# Patient Record
Sex: Female | Born: 1940
Health system: Western US, Academic
[De-identification: ages and names within clinical notes are randomized; demographics above are authoritative.]

---

## 2013-03-26 DIAGNOSIS — Z9079 Acquired absence of other genital organ(s): Secondary | ICD-10-CM

## 2013-03-26 DIAGNOSIS — Z90722 Acquired absence of ovaries, bilateral: Secondary | ICD-10-CM

## 2013-03-26 DIAGNOSIS — Z79890 Hormone replacement therapy (postmenopausal): Secondary | ICD-10-CM

## 2013-03-26 DIAGNOSIS — Z90710 Acquired absence of both cervix and uterus: Secondary | ICD-10-CM

## 2016-08-05 DIAGNOSIS — C8330 Diffuse large B-cell lymphoma, unspecified site: Secondary | ICD-10-CM

## 2016-08-09 DIAGNOSIS — D61810 Antineoplastic chemotherapy induced pancytopenia: Secondary | ICD-10-CM

## 2016-08-09 DIAGNOSIS — K1232 Oral mucositis (ulcerative) due to other drugs: Secondary | ICD-10-CM

## 2016-08-09 DIAGNOSIS — D708 Other neutropenia: Secondary | ICD-10-CM

## 2016-08-09 DIAGNOSIS — D701 Agranulocytosis secondary to cancer chemotherapy: Secondary | ICD-10-CM

## 2016-08-09 DIAGNOSIS — T451X5A Adverse effect of antineoplastic and immunosuppressive drugs, initial encounter: Secondary | ICD-10-CM

## 2016-08-09 DIAGNOSIS — T451X5S Adverse effect of antineoplastic and immunosuppressive drugs, sequela: Secondary | ICD-10-CM

## 2016-08-09 DIAGNOSIS — K1231 Oral mucositis (ulcerative) due to antineoplastic therapy: Secondary | ICD-10-CM

## 2016-10-17 ENCOUNTER — Telehealth: Payer: MEDICARE

## 2016-10-26 ENCOUNTER — Ambulatory Visit: Payer: BLUE CROSS/BLUE SHIELD

## 2016-10-28 ENCOUNTER — Ambulatory Visit: Payer: BLUE CROSS/BLUE SHIELD

## 2016-10-28 ENCOUNTER — Ambulatory Visit: Payer: MEDICARE

## 2016-10-28 DIAGNOSIS — G629 Polyneuropathy, unspecified: Secondary | ICD-10-CM

## 2016-10-28 DIAGNOSIS — R5383 Other fatigue: Secondary | ICD-10-CM

## 2016-10-31 ENCOUNTER — Telehealth: Payer: MEDICARE

## 2016-12-09 ENCOUNTER — Ambulatory Visit: Payer: MEDICARE

## 2016-12-09 ENCOUNTER — Ambulatory Visit: Payer: BLUE CROSS/BLUE SHIELD

## 2016-12-09 DIAGNOSIS — D708 Other neutropenia: Secondary | ICD-10-CM

## 2016-12-09 DIAGNOSIS — R5383 Other fatigue: Secondary | ICD-10-CM

## 2016-12-09 DIAGNOSIS — K1232 Oral mucositis (ulcerative) due to other drugs: Secondary | ICD-10-CM

## 2016-12-13 ENCOUNTER — Ambulatory Visit: Payer: MEDICARE

## 2017-01-04 ENCOUNTER — Telehealth: Payer: MEDICARE

## 2017-01-13 ENCOUNTER — Telehealth: Payer: MEDICARE

## 2017-01-13 ENCOUNTER — Ambulatory Visit: Payer: MEDICARE

## 2017-01-13 DIAGNOSIS — R5383 Other fatigue: Secondary | ICD-10-CM

## 2017-01-16 ENCOUNTER — Telehealth: Payer: BLUE CROSS/BLUE SHIELD

## 2017-01-31 ENCOUNTER — Ambulatory Visit: Payer: MEDICARE

## 2017-01-31 ENCOUNTER — Telehealth: Payer: BLUE CROSS/BLUE SHIELD

## 2017-02-13 ENCOUNTER — Telehealth: Payer: BLUE CROSS/BLUE SHIELD

## 2017-02-23 ENCOUNTER — Ambulatory Visit: Payer: MEDICARE

## 2017-02-24 ENCOUNTER — Ambulatory Visit: Payer: MEDICARE

## 2017-03-21 ENCOUNTER — Ambulatory Visit: Payer: MEDICARE

## 2017-03-27 ENCOUNTER — Ambulatory Visit: Payer: BLUE CROSS/BLUE SHIELD

## 2017-03-31 ENCOUNTER — Ambulatory Visit: Payer: BLUE CROSS/BLUE SHIELD

## 2017-03-31 DIAGNOSIS — R5383 Other fatigue: Secondary | ICD-10-CM

## 2017-03-31 DIAGNOSIS — T451X5A Adverse effect of antineoplastic and immunosuppressive drugs, initial encounter: Secondary | ICD-10-CM

## 2017-03-31 DIAGNOSIS — D61810 Antineoplastic chemotherapy induced pancytopenia: Secondary | ICD-10-CM

## 2017-04-04 ENCOUNTER — Ambulatory Visit: Payer: BLUE CROSS/BLUE SHIELD | Attending: Gynecologic Oncology

## 2017-05-10 ENCOUNTER — Telehealth: Payer: MEDICARE

## 2017-05-10 NOTE — Telephone Encounter
Dr. Renard Hamper,    Pt called, wanted to speak to you re: hard lump on her leg. Stated her PCP thinks it might be phlebitis and told her take aspirin but states it is not working. Would like to know what she could do.  [phone]    Thank you,  Annette Hatfield

## 2017-05-10 NOTE — Telephone Encounter
Lump behind knee   Not lymphoma  Ice pack and ace wrap  Not linear so unlikely phlebitis  Discussed with patient by phone

## 2017-06-22 ENCOUNTER — Telehealth: Payer: BLUE CROSS/BLUE SHIELD

## 2017-06-22 ENCOUNTER — Ambulatory Visit: Payer: MEDICARE

## 2017-06-22 ENCOUNTER — Telehealth: Payer: MEDICARE

## 2017-06-22 DIAGNOSIS — R5383 Other fatigue: Secondary | ICD-10-CM

## 2017-06-22 NOTE — Telephone Encounter
FYI only    Dr. Renard Hamper,    Scheduled pt for a PET/CT scan. Next available appointment is 06/30/17 @ 9:30 (reg).   Pt was placed on a cancellation list for a sooner appointment.   Confirmed appt with pt, if unable to get in sooner will call us to reschedule the follow-up appointment.     Thank you,  Chasey Dull

## 2017-06-22 NOTE — Telephone Encounter
Thanks

## 2017-06-22 NOTE — Telephone Encounter
Online- submitted order to [facility] for PET.

## 2017-06-23 ENCOUNTER — Ambulatory Visit: Payer: BLUE CROSS/BLUE SHIELD

## 2017-06-30 ENCOUNTER — Ambulatory Visit: Payer: MEDICARE

## 2017-06-30 ENCOUNTER — Telehealth: Payer: MEDICARE

## 2017-06-30 DIAGNOSIS — L989 Disorder of the skin and subcutaneous tissue, unspecified: Secondary | ICD-10-CM

## 2017-06-30 DIAGNOSIS — R5383 Other fatigue: Secondary | ICD-10-CM

## 2017-06-30 NOTE — Telephone Encounter
Dr Aaron Mose called asked if your recommendations are based on the final path report, she can be reached at [phone]     Thank you  Dewaine Oats

## 2017-06-30 NOTE — Telephone Encounter
Final report was atypical lymphoid infiltrate  Will get more info from complete excision

## 2017-06-30 NOTE — Patient Instructions
NASAL LARGE CELL LYMPHOMA:  As we discussed, it was reasonable to modify R-CHOP doses and to have switched to Gemzar given your side effects; fortunately your heart analysis was normal and your neuropathy should fade away over months; you have also finished RT      In Sept 2018 your follow up PET approximately 3 months after finishing RT showed complete remission (CR)    You developed a lesion on your right leg which was biopsied and called an atypical lymphoid infiltrate (possibly large cell lymphoma but NOT DEFINITIVE).  Your Dec 2018 PET was otherwise negative for relapsed lymphoma.    I recommend:  1. Surgical removal of the right thigh lesion and additional testing including molecular testing for immunoglobulin gene rearrangement (to determine clonality)  2. Careful follow up and likely repeat PET in 4-6 months if stable  3. Discuss with Dr Harden Mo possibly seeing a pulmonologist for bronchoscopy to remove the likely mucus plug in your lung airway    Your other blood tests were fine    You are doing all of the appropriate health maintenance habits and testing

## 2017-06-30 NOTE — Telephone Encounter
Discussed with pt by phone

## 2017-06-30 NOTE — Progress Notes
PATIENT: Annette Hatfield  MRN: [id number]  DOB: 23-Jan-1941  DATE OF SERVICE: 06/30/2017    CHIEF COMPLAINT:   Nasal large cell NHL; f/u    Subjective:      Annette Hatfield is a 76 y.o. 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  On 10-07-16 she presented with her retired urologist husband in the midst of R-CHOP rx with Dr Alyce Pagan.  She described substantial side effects with atypical CP prompting ASAP cor work up with apparently normal ECHO and negative stress test but also switched to Gemzar from Adriamycin.  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  She had been receiving sporadic Neupogen and was due next Thursday for C3 D8 Gemzar.  We discussed ASAP PET and seeing Rad Onc for possible IFRT vs 3 more modified R-CHOP (back to 80 % Adria from Gemzar and no VCR)    On 10-28-16 she presented in f/u with 10-25-16 PET demonstrating complete resolution of the right nasal mass and right submandibular LN and with some non-specific let nasopharynx PET + area c/w the flare in her seasonal allergies.  She had an MRI of the area today.  I concurred with no more chemo and f/u for IFRT with Rad Onc and likely repeat PET 3 months after finishing RT then prn.  I recommended keeping port for now with q 6 week flushes    On 12-09-16 she presented in f/u on her last day of RT.  She was feeling much better with no significant residual c/o.  We discussed port flush x 2 more with PET in 3 mo and if CR then remove port    On 01-13-17 she presented with her husband without c/o and recently having been seen on 01-03-17 by Dr Bonney Leitz with exam confirming no recurrence locally.  She had a wedding of a grand-daughter in Sept 2018 and wanted her port removed to allow for a certain dress so she was referred to IR.  She will repeat a PET in early Oct 2018 and f/u here.    On 03-31-17 she presented without c/o and with her Sept 2018 PET-CT c/w CR. She also had a recent mammo and Cologuard test which were negative.  She has f/u scheduled with Dr Bonney Leitz and was told to f/u in 3 months. Her recent Sept 2018 blood tests were fine    On 06-30-17 she presented in f/u with a right thigh lesion bx showing an atypical lymphoid infiltrate and Dec 2018 PET without evidence of relapsed NHL.  I recommended complete excision and molecular testing for clonality and no further rx for now but did discuss possible R-ICE or the equivalent if systemic relapse.  She will f/u in 3 mo    HISTORY OF PRESENT ILLNESS:  The patient has been in generally excellent health. She has been quite focused on natural and alternative therapy throughout her life and healthy habits. 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  ???  She had been in her usual state of health until she began having trouble with some swelling along her right nose. Initially, she thought it might be skin cancer related, given a prior basal cell, but this has progressed. She was referred to ENT, which took some time for initial evaluation and then also referred for a CT scan which took time to schedule. She also underwent a biopsy of a right nasal  tissue abnormality which on 07/15/2016 showed a diffuse large B-cell lymphoma, non germinal center type. In addition, she underwent a PET-CT scan on 08/03/2016, which showed a 2.1 cm right nasal cavity abnormality and a new 2.5 cm right submandibular lymph node.  ???  The patient presented with her husband, a retired Insurance underwriter, for a detailed discussion of possible therapeutic options. I discussed with her standard therapy and then discussed the modification she felt were quite important in her potential future treatment. The patient had no other new complaints and specifically denies fevers, sweats, anorexia, weight loss, lymph node swelling beyond the right submandibular area, skin rash, joint swelling, or bone pain.  ?????? ALLERGIES:  No reported drug allergies.  ???  PAST SURGICAL HISTORY:  No significant prior surgical procedures beyond the biopsies noted above.  ???  PAST MEDICAL HISTORY:  No significant prior medical problems.  ???  FAMILY HISTORY:  The patient's mother had breast cancer in her mid 5s and had colorectal cancer. The patient has 1 sister, 1 son and 1 daughter.    Review of Systems  - Negative for constitutional, neurological, ophthalmological, otological, cardiovascular, respiratory, gastrointestinal, genitourinary, psychological or musculoskeletal symptoms except as noted above    Outpatient Medications Prior to Visit   Medication Sig   ??? Desloratadine (CLARINEX PO) Take by mouth daily as needed.   ??? estradiol (VIVELLE-DOT) 0.0375 mg/24 hr twice weekly patch Place 1 patch onto the skin two (2) times a week.   ??? ESTRING 2 MG vaginal ring PLACE 1 DEVICE VAGINALLY EVERY THREE (3) MONTHS FOLLOW PACKAGE DIRECTIONS.Marland Kitchen     No facility-administered medications prior to visit.          Objective:      Physical Exam BP 120/76  ~ Pulse 104  ~ Temp 36.6 ???C (97.8 ???F) (Tympanic)  ~ Resp 20  ~ Ht 157.5 cm  ~ Wt 58.1 kg (128 lb)  ~ BMI 23.41 kg/m???       Wt Readings from Last 3 Encounters:   06/30/17 58.1 kg (128 lb)   03/31/17 58.5 kg (129 lb)   01/13/17 59.1 kg (130 lb 3.2 oz)     General Appearance:    Alert, cooperative, no distress, appears stated age   Head:    Normocephalic, without obvious abnormality, atraumatic; chemo induced alopecia   Eyes:    conjunctiva/corneas clear, EOM's intact   Ears:    Not done   Nose:   Nares normal, septum midline   Throat:   Lips, mucosa, and tongue normal   Neck:   Supple, symmetrical, trachea midline, no adenopathy   Back:     Symmetric, no curvature   Lungs:     Clear to auscultation bilaterally, respirations unlabored   Chest Wall:    No tenderness or deformity; right chest port removed    Heart:    Regular rate and rhythm, S1 and S2 normal, no murmur, rub    or gallop Breast Exam:    Not done   Abdomen:     Soft, non-tender, bowel sounds active all four quadrants,     no masses, no organomegaly   Genitalia:    Not done   Rectal:    Not done   Extremities:   Extremities normal, atraumatic, no cyanosis or edema   Pulses:   Not done   Skin:   Skin color, texture, turgor normal, no rashes or lesions   Lymph nodes:   Cervical, supraclavicular, and axillary nodes normal  Neurologic:   CNII-XII intact, normal strength, sensation and gait     Assessment:       LABS:  06-21-17 CBC OK Hb 14.5 CMP OK CR 1.0 Vit D  91 A1c 5.5    No visits with results within 4 Week(s) from this visit.   Latest known visit with results is:   Lab Visit on 03/21/2017   Component Date Value Ref Range Status   ??? WBC (LabDAQ) 03/21/2017 7.9  4.0 - 10.0 10???/uL Final   ??? RBC (LabDAQ) 03/21/2017 4.1  3.9 - 6.1 x10E6/uL Final   ??? Hemoglobin (LabDAQ) 03/21/2017 13.2  11.2 - 15.7 g/dL Final   ??? Hematocrit (LabDAQ) 03/21/2017 40.0  34.1 - 44.9 % Final   ??? MCV (LabDAQ) 03/21/2017 98.0* 79.0 - 94.8 fL Final   ??? MCH (LabDAQ) 03/21/2017 32.4* 25.6 - 32.2 pg Final   ??? MCHC (LabDAQ) 03/21/2017 33.0  32.2 - 36.5 g/dL Final   ??? RDW-CV (LabDAQ) 03/21/2017 13.0  11.6 - 14.4 % Final   ??? Platelets (LabDAQ) 03/21/2017 259  163 - 369 10???/uL Final   ??? MPV (LabDAQ) 03/21/2017 11.2  9.4 - 12.4 fL Final   ??? Neutrophil % (LabDAQ) 03/21/2017 79.9* 34.0 - 71.1 % Final   ??? Lymphocyte % (LabDAQ) 03/21/2017 12.2* 19.3 - 53.1 % Final   ??? Monocyte % (LabDAQ) 03/21/2017 6.7  4.7 - 12.5 % Final   ??? Eosinophil % (LabDAQ) 03/21/2017 0.6* 0.7 - 7.0 % Final   ??? Basophil % (LabDAQ) 03/21/2017 0.5  0.1 - 1.2 % Final   ??? Neutrophil # (LabDAQ) 03/21/2017 6.32* 1.56 - 6.13 10???/uL Final   ??? Lymphocyte # (LabDAQ) 03/21/2017 0.97* 1.18 - 3.74 10???/uL Final   ??? Monocyte # (LabDAQ) 03/21/2017 0.53  0.24 - 0.86 10???/uL Final   ??? Eosinophil # (LabDAQ) 03/21/2017 0.05  0.04 - 0.54 10???/uL Final   ??? Basophil # (LabDAQ) 03/21/2017 0.04  0.01 - 0.08 10???/uL Final ??? IG% (LabDAQ) 03/21/2017 0.1  0.0 - 5.0 % Final   ??? IG# (LabDAQ) 03/21/2017 0.01  0.00 - 0.50 10 Final    IG=Immature Granulocytes.   ??? Sodium 03/21/2017 145  135 - 146 mmol/L Final   ??? Potassium 03/21/2017 4.8  3.6 - 5.3 mmol/L Final   ??? Chloride 03/21/2017 103  96 - 106 mmol/L Final   ??? Total CO2 03/21/2017 26  20 - 30 mmol/L Final   ??? Anion Gap 03/21/2017 16  8 - 19 Final   ??? Glucose 03/21/2017 97  65 - 99 mg/dL Final   ??? GFR Estimate for Non-African Ameri* 03/21/2017 71  See GFR Additional Information Final   ??? GFR Estimate for African American 03/21/2017 82  See GFR Additional Information Final   ??? GFR Additional Information 03/21/2017 See Comment   Final    GFR >89        Normal  GFR 60 - 89    Normal to mildly decreased  GFR 45 - 59    Mildly to moderately decreased  GFR 30 - 44    Moderately to severely decreased  GFR 15 - 29    Severely decreased  GFR <15        Kidney failure  The CKD-EPI equation was used to estimate GFR and assumes stable creatinine concentrations.  Results are in mL/min/1.73 square meters.   ??? Creatinine 03/21/2017 0.81  0.60 - 1.30 mg/dL Final   ??? Urea Nitrogen 03/21/2017 16  7 - 22 mg/dL Final   ???  Calcium 03/21/2017 9.7  8.6 - 10.4 mg/dL Final   ??? Total Protein 03/21/2017 6.7  6.1 - 8.2 g/dL Final   ??? Albumin 45/40/9811 4.8  3.9 - 5.0 g/dL Final   ??? Bilirubin,Total 03/21/2017 0.7  0.1 - 1.2 mg/dL Final   ??? Alkaline Phosphatase 03/21/2017 60  37 - 113 U/L Final   ??? Aspartate Aminotransferase 03/21/2017 19  13 - 47 U/L Final   ??? Alanine Aminotransferase 03/21/2017 17  8 - 64 U/L Final     12-09-16 CBC OK Hb 12.7   CMP OK Cr 0.74  (last day of RT)  10-28-16 CBC 9.1 > 10.4 <104> 489  CMP OK Cr 0.82  B 12 > 4000  TSH 1.6 Vit D  77 (s-p 3 cycles chemo; April 2018 PET CR; proceed with IFRT)  10-06-16 CBC 40.9 > 11.9 < 232  CMP OK Cr 0.9 ALT/ AST 76/ 49  (C3 D1 R & CTX & Gem & pred)  07/25/2016, CBC: White count 14.3, hemoglobin 15.1, MCV 99, platelets 354. CMP normal with a creatinine of 1.0, LDH 144, vitamin D 91, B12 679.     IMAGING:  06-26-17 PET-CT:  Negative except for tubular structure RML (impacted bronchus); mild non-specific distal right thigh uptake    03-27-17 PET-CT:  Negative    10-25-16 PET-CT:  Resolution of both right nasal cavity and right submandibular mass; focal + left posterior nasopharynx (MRI suggested)  08/03/2016, PET-CT:   2.1 cm right nasal cavity mass and a new 2.5 cm right submandibular lymph node (as compared to a scan in December)    09-23-16 EXERCISE SPECT:  Hyperdynamic EF 78%; no ischemia    PATHOLOGY:  06-07-17 SKIN RIGHT THIGH:  Atypical lymphoid infiltrate (final report)    right nasal cavity tissue biopsy, diffuse large B-cell lymphoma, non germinal center type with immunohistochemistry negative for BCL 1 and BCL 6 and negative for c-myc and EBV. Ki 67 80-90 %      Plan:       1. NASAL LARGE CELL NHL:   Started with concerns about side effects and described substantial particularly neuropathy type with R-CHOP with switch from Adria to Gemzar and VCR held from C3; given presumptive CR on 10-25-16 PET I concurred with no more chemo and proceeding with IFRT; flush port; Sept 2018 repeat PET (3-4 months after finishing RT) showed CR so scans prn only; NED per Dr Bonney Leitz in June 2018 with continued every 4-6 mo f/u and Sept 2018 PET showed CR; Dec 2018 right thigh lesion with atypical lymphoid infiltrate; Dec 2018 PET without evidence of relapsed NHL.  I recommended complete excision and molecular testing for clonality and no further rx for now but did discuss possible R-ICE or the equivalent if systemic relapse.  She will f/u in 3 mo    2. Pancytopenia/ neutropenia, chemo induced:   Resolved off chemo; received 3-5 Neupogen per cycle at [facility]    3. Neuropathy:   Resolved by Sept 2018; likely reaction to VCR    4. Anxiety:   Not interested in med rx    5. Port:   Removed by IR in June 2018    6. Thigh skin lesion; Dec 2018 right thigh lesion with atypical lymphoid infiltrate; Dec 2018 PET without evidence of relapsed NHL.  I recommended complete excision and molecular testing for clonality and no further rx for now but did discuss possible R-ICE or the equivalent if systemic relapse.  She will f/u in 3  mo        PLAN:  NASAL LARGE CELL LYMPHOMA:  As we discussed, it was reasonable to modify R-CHOP doses and to have switched to Gemzar given your side effects; fortunately your heart analysis was normal and your neuropathy should fade away over months; you have also finished RT      In Sept 2018 your follow up PET approximately 3 months after finishing RT showed complete remission (CR)    You developed a lesion on your right leg which was biopsied and called an atypical lymphoid infiltrate (possibly large cell lymphoma but NOT DEFINITIVE).  Your Dec 2018 PET was otherwise negative for relapsed lymphoma.    I recommend:  1. Surgical removal of the right thigh lesion and additional testing including molecular testing for immunoglobulin gene rearrangement (to determine clonality)  2. Careful follow up and likely repeat PET in 4-6 months if stable  3. Discuss with Dr Stephens Shire possibly seeing a pulmonologist for bronchoscopy to remove the likely mucus plug in your lung airway    Your other blood tests were fine    You are doing all of the appropriate health maintenance habits and testing    The low lymphocytes are due to the Rituxan and are of no clinical significance      Author:  Theressa Millard 06/30/2017 10:07 AM

## 2017-07-05 ENCOUNTER — Telehealth: Payer: MEDICARE

## 2017-07-06 NOTE — Telephone Encounter
Dr Renard Hamper,  Pt called and is very confused. She would like to know where & how she is supposed to get the molecular testing for immunoglobulin gene rearrangement you recommended and will you provide the order.  Please call her at [phone]

## 2017-07-06 NOTE — Telephone Encounter
Spoke with Annette Hatfield from Dr. Almyra Free office, faxed over recent scans that was requested by pt to be sent. Also, notify that Dr. Renard Hamper would like the following testings listed below. Provided information of lab that we use, [facility].  Spoke with pt as well.

## 2017-07-06 NOTE — Telephone Encounter
Please have specimen sent to wherever the bone marrow specimens are sent  [facility] TOLD TO CALL [place] [facility]ICE    MUST ASK FOR   FLOW CYTOMETRY (IF POSSIBLE)  IMMUNOHISTOCHEMICAL STAINS FOR KAPPA & LAMBDA  IMMUNOGLOBULIN HEAVY CHAIN GENE REARRANGEMENT STUDIES    SPECIMEN WILL BE A THIGH SKIN LESION

## 2017-07-06 NOTE — Telephone Encounter
Spoke with Mrs Annette Hatfield let her know everything has been ordered and faxed, pt understood will go to Dr hung on 07/19/17    Spoke with Marzetta Board at Dr Benson [place] office received pathology and records

## 2017-07-06 NOTE — Telephone Encounter
thanks

## 2017-07-25 ENCOUNTER — Ambulatory Visit: Payer: BLUE CROSS/BLUE SHIELD

## 2017-07-26 ENCOUNTER — Telehealth: Payer: MEDICARE

## 2017-07-26 NOTE — Telephone Encounter
Dr. Renard Hamper,    Deliah from [facility] called and asked for the B-Cell Rearrangement test would you like them to use fresh tissue or paraffin embedded tissue.    Ref # [id number]  Call back # [phone]    Thank you,  Paulita Cradle

## 2017-07-26 NOTE — Telephone Encounter
Returned call  IgH chain rearrangement study ideally on fresh tissue

## 2017-08-06 ENCOUNTER — Ambulatory Visit: Payer: MEDICARE

## 2017-08-07 ENCOUNTER — Ambulatory Visit: Payer: BLUE CROSS/BLUE SHIELD

## 2017-08-08 ENCOUNTER — Ambulatory Visit: Payer: MEDICARE

## 2017-08-08 NOTE — Telephone Encounter
DISCUSSED WITH PATIENT BY PHONE    REPEAT BX CONFIRMED LARGE CELL NHL IN LEG SKIN    STANDARD OF CARE:  R-ICE OR THE EQUIVALENT FOLLOWED BY AUTO-SCT DISCUSSED    STRONGLY AGAINST MORE CHEMO SO ALTERNATIVE:  IFRT WITH DR Cherene Altes  RITUXAN AND REVLIMID (R-2 REGIMEN)

## 2017-08-09 ENCOUNTER — Telehealth: Payer: BLUE CROSS/BLUE SHIELD

## 2017-08-09 NOTE — Telephone Encounter
Per pt request faxed pathology to PCP Dr Eliott Nine

## 2017-08-11 ENCOUNTER — Ambulatory Visit: Payer: MEDICARE

## 2017-08-11 LAB — Comprehensive Metabolic Panel
ALBUMIN: 4.6 g/dL (ref 3.9–5.0)
CALCIUM: 9.9 mg/dL (ref 8.6–10.4)

## 2017-08-11 LAB — Lactate Dehydrogenase: LACTATE DEHYDROGENASE: 151 U/L (ref 125–256)

## 2017-08-11 MED ORDER — LENALIDOMIDE 25 MG PO CAPS
25 mg | Freq: Every day | ORAL | 5 refills | Status: AC
Start: 2017-08-11 — End: ?

## 2017-08-11 NOTE — Progress Notes
PATIENT: Annette Hatfield  MRN: [id number]  DOB: [birth year]  DATE OF SERVICE: 08/11/2017    CHIEF COMPLAINT:   Nasal large cell NHL; f/u    Subjective:      Annette Hatfield is a 77 y.o. 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  On 10-07-16 she presented with her retired urologist husband in the midst of R-CHOP rx with Dr Alyce Pagan.  She described substantial side effects with atypical CP prompting ASAP cor work up with apparently normal ECHO and negative stress test but also switched to Gemzar from Adriamycin.  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  She had been receiving sporadic Neupogen and was due next Thursday for C3 D8 Gemzar.  We discussed ASAP PET and seeing Rad Onc for possible IFRT vs 3 more modified R-CHOP (back to 80 % Adria from Gemzar and no VCR)    On 10-28-16 she presented in f/u with 10-25-16 PET demonstrating complete resolution of the right nasal mass and right submandibular LN and with some non-specific let nasopharynx PET + area c/w the flare in her seasonal allergies.  She had an MRI of the area today.  I concurred with no more chemo and f/u for IFRT with Rad Onc and likely repeat PET 3 months after finishing RT then prn.  I recommended keeping port for now with q 6 week flushes    On 12-09-16 she presented in f/u on her last day of RT.  She was feeling much better with no significant residual c/o.  We discussed port flush x 2 more with PET in 3 mo and if CR then remove port    On 01-13-17 she presented with her husband without c/o and recently having been seen on 01-03-17 by Dr Bonney Leitz with exam confirming no recurrence locally.  She had a wedding of a grand-daughter in Sept 2018 and wanted her port removed to allow for a certain dress so she was referred to IR.  She will repeat a PET in early Oct 2018 and f/u here.    On 03-31-17 she presented without c/o and with her Sept 2018 PET-CT c/w CR. She also had a recent mammo and Cologuard test which were negative.  She has f/u scheduled with Dr Bonney Leitz and was told to f/u in 3 months. Her recent Sept 2018 blood tests were fine    On 06-30-17 she presented in f/u with a right thigh lesion bx showing an atypical lymphoid infiltrate and Dec 2018 PET without evidence of relapsed NHL.  I recommended complete excision and molecular testing for clonality and no further rx for now but did discuss possible R-ICE or the equivalent if systemic relapse.  She will f/u in 3 mo    On 08-11-17 she presented after repeat incisional thigh lesion bx confirming relapsed large cell NHL.  We had had multiple phone conversations before and after the repeat bx and again today in person (over 50 minutes). While I recommended R-ICE then auto SCT she continued to resist any multi agent chemo, even if the only known path to possible cure.  She was concerned about side effects and her being able to help her ailing husband.  I recommended as a compromise Rituxan & Revlimid and IFRT with Dr Clinton Sawyer to the thigh lesion.  The rationale and side effects were discussed in detail and numerous questions answered.  She will tentatively start in 2 weeks on R-2 regimen  HISTORY OF PRESENT ILLNESS:  The patient has been in generally excellent health. She has been quite focused on natural and alternative therapy throughout her life and healthy habits. 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  ???  She had been in her usual state of health until she began having trouble with some swelling along her right nose. Initially, she thought it might be skin cancer related, given a prior basal cell, but this has progressed. She was referred to ENT, which took some time for initial evaluation and then also referred for a CT scan which took time to schedule. She also underwent a biopsy of a right nasal tissue abnormality which on 07/15/2016 showed a diffuse large B-cell lymphoma, non germinal center type. In addition, she underwent a PET-CT scan on 08/03/2016, which showed a 2.1 cm right nasal cavity abnormality and a new 2.5 cm right submandibular lymph node.  ???  The patient presented with her husband, a retired Insurance underwriter, for a detailed discussion of possible therapeutic options. I discussed with her standard therapy and then discussed the modification she felt were quite important in her potential future treatment. The patient had no other new complaints and specifically denies fevers, sweats, anorexia, weight loss, lymph node swelling beyond the right submandibular area, skin rash, joint swelling, or bone pain.  ??????  ALLERGIES:  No reported drug allergies.  ???  PAST SURGICAL HISTORY:  No significant prior surgical procedures beyond the biopsies noted above.  ???  PAST MEDICAL HISTORY:  No significant prior medical problems.  ???  FAMILY HISTORY:  The patient's mother had breast cancer in her mid 42s and had colorectal cancer. The patient has 1 sister, 1 son and 1 daughter.    Review of Systems  - Negative for constitutional, neurological, ophthalmological, otological, cardiovascular, respiratory, gastrointestinal, genitourinary, psychological or musculoskeletal symptoms except as noted above    Outpatient Medications Prior to Visit   Medication Sig   ??? Desloratadine (CLARINEX PO) Take by mouth daily as needed.   ??? estradiol (VIVELLE-DOT) 0.0375 mg/24 hr twice weekly patch Place 1 patch onto the skin two (2) times a week.   ??? ESTRING 2 MG vaginal ring PLACE 1 DEVICE VAGINALLY EVERY THREE (3) MONTHS FOLLOW PACKAGE DIRECTIONS.Marland Kitchen     No facility-administered medications prior to visit.          Objective:      Physical Exam Ht 157.5 cm  ~ BMI 23.41 kg/m???     BP 120/74  ~ Pulse 68  ~ Temp 36.4 ???C (97.6 ???F) (Tympanic)  ~ Resp 17  ~ Ht 157.5 cm  ~ Wt 56.8 kg (125 lb 3.2 oz)  ~ Breastfeeding? No  ~ BMI 22.89 kg/m???   Wt Readings from Last 3 Encounters: 06/30/17 58.1 kg (128 lb)   03/31/17 58.5 kg (129 lb)   01/13/17 59.1 kg (130 lb 3.2 oz)     General Appearance:    Alert, cooperative, no distress, appears stated age   Head:    Normocephalic, without obvious abnormality, atraumatic; chemo induced alopecia   Eyes:    conjunctiva/corneas clear, EOM's intact   Ears:    Not done   Nose:   Nares normal, septum midline   Throat:   Lips, mucosa, and tongue normal   Neck:   Supple, symmetrical, trachea midline, no adenopathy   Back:     Symmetric, no curvature   Lungs:     Clear to auscultation  bilaterally, respirations unlabored   Chest Wall:    No tenderness or deformity; right chest port removed    Heart:    Regular rate and rhythm, S1 and S2 normal, no murmur, rub    or gallop   Breast Exam:    Not done   Abdomen:     Soft, non-tender, bowel sounds active all four quadrants,     no masses, no organomegaly   Genitalia:    Not done   Rectal:    Not done   Extremities:   Extremities normal, atraumatic, no cyanosis or edema   Pulses:   Not done   Skin:   Skin color, texture, turgor normal, no rashes or lesions   Lymph nodes:   Cervical, supraclavicular, and axillary nodes normal   Neurologic:   CNII-XII intact, normal strength, sensation and gait     Assessment:       LABS:    Initial consult on 08/11/2017   Component Date Value Ref Range Status   ??? WBC (LabDAQ) 08/11/2017 14.3* 4.0 - 10.0 10???/uL Final   ??? RBC (LabDAQ) 08/11/2017 4.4  3.9 - 6.1 x10E6/uL Final   ??? Hemoglobin (LabDAQ) 08/11/2017 14.4  11.2 - 15.7 g/dL Final   ??? Hematocrit (LabDAQ) 08/11/2017 44.1  34.1 - 44.9 % Final   ??? MCV (LabDAQ) 08/11/2017 99.5* 79.0 - 94.8 fL Final   ??? MCH (LabDAQ) 08/11/2017 32.5* 25.6 - 32.2 pg Final   ??? MCHC (LabDAQ) 08/11/2017 32.7  32.2 - 36.5 g/dL Final   ??? RDW-CV (LabDAQ) 08/11/2017 12.0  11.6 - 14.4 % Final   ??? Platelets (LabDAQ) 08/11/2017 260  163 - 369 10???/uL Final   ??? MPV (LabDAQ) 08/11/2017 9.9  9.4 - 12.4 fL Final ??? Neutrophil % (LabDAQ) 08/11/2017 88.3* 34.0 - 71.1 % Final   ??? Lymphocyte % (LabDAQ) 08/11/2017 4.5* 19.3 - 53.1 % Final   ??? Monocyte % (LabDAQ) 08/11/2017 6.6  4.7 - 12.5 % Final   ??? Eosinophil % (LabDAQ) 08/11/2017 0.2* 0.7 - 7.0 % Final   ??? Basophil % (LabDAQ) 08/11/2017 0.3  0.1 - 1.2 % Final   ??? Neutrophil # (LabDAQ) 08/11/2017 12.64* 1.56 - 6.13 10???/uL Final   ??? Lymphocyte # (LabDAQ) 08/11/2017 0.65* 1.18 - 3.74 10???/uL Final   ??? Monocyte # (LabDAQ) 08/11/2017 0.95* 0.24 - 0.86 10???/uL Final   ??? Eosinophil # (LabDAQ) 08/11/2017 0.03* 0.04 - 0.54 10???/uL Final   ??? Basophil # (LabDAQ) 08/11/2017 0.04  0.01 - 0.08 10???/uL Final   ??? IG% (LabDAQ) 08/11/2017 0.1  0.0 - 5.0 % Final   ??? IG# (LabDAQ) 08/11/2017 0.02  0.00 - 0.50 10 Final    IG=Immature Granulocytes.     06-21-17 CBC OK Hb 14.5 CMP OK CR 1.0 Vit D  91 A1c 5.5    12-09-16 CBC OK Hb 12.7   CMP OK Cr 0.74  (last day of RT)  10-28-16 CBC 9.1 > 10.4 <104> 489  CMP OK Cr 0.82  B 12 > 4000  TSH 1.6 Vit D  77 (s-p 3 cycles chemo; April 2018 PET CR; proceed with IFRT)  10-06-16 CBC 40.9 > 11.9 < 232  CMP OK Cr 0.9 ALT/ AST 76/ 49  (C3 D1 R & CTX & Gem & pred)  07/25/2016, CBC: White count 14.3, hemoglobin 15.1, MCV 99, platelets 354. CMP normal with a creatinine of 1.0, LDH 144, vitamin D 91, B12 679.     IMAGING:  06-26-17 PET-CT:  Negative except  for tubular structure RML (impacted bronchus); mild non-specific distal right thigh uptake    03-27-17 PET-CT:  Negative    10-25-16 PET-CT:  Resolution of both right nasal cavity and right submandibular mass; focal + left posterior nasopharynx (MRI suggested)  08/03/2016, PET-CT:   2.1 cm right nasal cavity mass and a new 2.5 cm right submandibular lymph node (as compared to a scan in December)    09-23-16 EXERCISE SPECT:  Hyperdynamic EF 78%; no ischemia    PATHOLOGY:  07-21-17 larger skin excisional bx:        06-07-17 SKIN RIGHT THIGH:  Atypical lymphoid infiltrate (final report) right nasal cavity tissue biopsy, diffuse large B-cell lymphoma, non germinal center type with immunohistochemistry negative for BCL 1 and BCL 6 and negative for c-myc and EBV. Ki 67 80-90 %      Plan:       1. NASAL LARGE CELL NHL:   Started with concerns about side effects and described substantial particularly neuropathy type with R-CHOP with switch from Adria to Gemzar and VCR held from C3; given presumptive CR on 10-25-16 PET I concurred with no more chemo and proceeding with IFRT; flush port; Sept 2018 repeat PET (3-4 months after finishing RT) showed CR so scans prn only; NED per Dr Bonney Leitz in June 2018 with continued every 4-6 mo f/u and Sept 2018 PET showed CR; Dec 2018 right thigh lesion with atypical lymphoid infiltrate; Dec 2018 PET without evidence of relapsed NHL.  I recommended complete excision and molecular testing for clonality and no further rx for now but did discuss possible R-ICE or the equivalent if systemic relapse.   In Jan 2019 repeat thigh skin lesion bx confirmed relapsed large cell NHL.  While I recommended R-ICE then auto SCT she continued to resist any multi agent chemo, even if the only known path to possible cure.  She was concerned about side effects and her being able to help her ailing husband.  I recommended as a compromise Rituxan & Revlimid and IFRT with Dr Clinton Sawyer to the thigh lesion.  The rationale and side effects were discussed in detail and numerous questions answered on 08-11-17.  She will tentatively start in 2 weeks on R-2 regimen    2. Pancytopenia/ neutropenia, chemo induced:   Anticipated to be mild on R-2 regimen   Resolved off chemo; received 3-5 Neupogen per cycle at [facility]    3. Neuropathy:   Resolved by Sept 2018; likely reaction to VCR    4. Anxiety:   Not interested in med rx    5. Port:   Removed by IR in June 2018    6. Thigh skin lesion;   Dec 2018 right thigh lesion with atypical lymphoid infiltrate; Dec 2018 PET without evidence of relapsed NHL;  repeat incisional thigh lesion bx confirming relapsed large cell NHL.  While I recommended R-ICE then auto SCT she continued to resist any multi agent chemo, even if the only known path to possible cure.  She was concerned about side effects and her being able to help her ailing husband.  I recommended as a compromise Rituxan & Revlimid and IFRT with Dr Clinton Sawyer to the thigh lesion.  The rationale and side effects were discussed in detail and numerous questions answered.  She will tentatively start in 2 weeks on R-2 regimen      PLAN:  NASAL LARGE CELL LYMPHOMA, relapsed:  As we discussed, it was reasonable to modify R-CHOP doses and to have switched to Gemzar  given your side effects; fortunately your heart analysis was normal and your neuropathy should fade away over months; you have also finished RT      In Sept 2018 your follow up PET approximately 3 months after finishing RT showed complete remission (CR)   Your Dec 2018 PET was otherwise negative for relapsed lymphoma except for the thigh skin lesion.    The biopsy confirmed recurrent large cell lymphoma:    1. Standard treatment would involve multi agent chemo and Rituxan: R-ICE   R = rituxan I = ifosfamide  C = carboplatin E = etoposide   Iv days 1-3 then neulasta every 3 weeks x 3-4 then auto stem cell transplant (SCT)    2. An alternative and milder but no evidence of being curative therapy:   Rituxan iv and Revlimid = lenalidomide 25 mg daily x 21 every 28 days; radiation therapy (RT) to the skin lesion  Iv schedule (each cycle 28 days(  Cycle 1: days 1, 8, 15   Cycles 2-6; days 1, 15      For more information check out [email or web address]    Let's plan to start in 2 weeks    Author:  Theressa Millard 08/11/2017 9:07 AM

## 2017-08-11 NOTE — Patient Instructions
NASAL LARGE CELL LYMPHOMA, relapsed:  As we discussed, it was reasonable to modify R-CHOP doses and to have switched to Gemzar given your side effects; fortunately your heart analysis was normal and your neuropathy should fade away over months; you have also finished RT      In Sept 2018 your follow up PET approximately 3 months after finishing RT showed complete remission (CR)   Your Dec 2018 PET was otherwise negative for relapsed lymphoma except for the thigh skin lesion.    The biopsy confirmed recurrent large cell lymphoma:    1. Standard treatment would involve multi agent chemo and Rituxan: R-ICE   R = rituxan I = ifosfamide  C = carboplatin E = etoposide   Iv days 1-3 then neulasta every 3 weeks x 3-4 then auto stem cell transplant (SCT)    2. An alternative and milder but no evidence of being curative therapy:   Rituxan iv and Revlimid = lenalidomide 25 mg daily x 21 every 28 days; radiation therapy (RT) to the skin lesion   Iv schedule (each cycle 28 days(  Cycle 1: days 1, 8, 15   Cycles 2-6; days 1, 15    For more information check out [email or web address]    Let's plan to start in 2 weeks

## 2017-08-18 ENCOUNTER — Telehealth: Payer: BLUE CROSS/BLUE SHIELD

## 2017-08-18 ENCOUNTER — Ambulatory Visit: Payer: BLUE CROSS/BLUE SHIELD

## 2017-08-18 NOTE — Telephone Encounter
Dr. Salome Arnt,   I phoned pt., as I need to have her enroll in the [employer] For authorizations on her Revlimid in order to get them filled at her pharmacy. She stated that she would like to speak with you in regards to Revlimid before she continues with the enrollment process and that she is not too comfortable with the idea of taking this medication. She can be reached at [phone].    Tank you,  Susie

## 2017-08-19 NOTE — Telephone Encounter
Spoke with Annette Hatfield. She is aware and will wait to see Dr. Gerald Leitz to discuss Revlimid, as she does not want to discuss this with Dr. Renard Hamper.

## 2017-08-19 NOTE — Telephone Encounter
Will discuss with pt once I actually meet here, have not yet seen her.

## 2017-08-28 ENCOUNTER — Ambulatory Visit: Payer: MEDICARE

## 2017-08-29 ENCOUNTER — Ambulatory Visit: Payer: MEDICARE | Attending: Hematology & Oncology

## 2017-08-29 ENCOUNTER — Ambulatory Visit: Payer: BLUE CROSS/BLUE SHIELD | Attending: Hematology & Oncology

## 2017-08-29 DIAGNOSIS — T451X5A Adverse effect of antineoplastic and immunosuppressive drugs, initial encounter: Secondary | ICD-10-CM

## 2017-08-29 DIAGNOSIS — C8330 Diffuse large B-cell lymphoma, unspecified site: Secondary | ICD-10-CM

## 2017-08-29 DIAGNOSIS — K1232 Oral mucositis (ulcerative) due to other drugs: Secondary | ICD-10-CM

## 2017-08-29 DIAGNOSIS — D61810 Antineoplastic chemotherapy induced pancytopenia: Secondary | ICD-10-CM

## 2017-08-29 DIAGNOSIS — D708 Other neutropenia: Secondary | ICD-10-CM

## 2017-08-29 LAB — Phosphorus: PHOSPHORUS: 3 mg/dL (ref 2.3–4.4)

## 2017-08-29 LAB — Comprehensive Metabolic Panel
BILIRUBIN,TOTAL: 0.4 mg/dL (ref 0.1–1.2)
CALCIUM: 9.4 mg/dL (ref 8.6–10.4)

## 2017-08-29 LAB — Uric Acid: URIC ACID: 5.3 mg/dL (ref 2.9–7.0)

## 2017-08-29 LAB — Lactate Dehydrogenase: LACTATE DEHYDROGENASE: 169 U/L (ref 125–256)

## 2017-08-29 MED ORDER — LENALIDOMIDE 10 MG PO CAPS
10 mg | Freq: Every day | ORAL | 0 refills | Status: AC
Start: 2017-08-29 — End: 2017-08-31

## 2017-08-29 MED ADMIN — RITUXIMAB (RITUXAN) INFUSION 500 ML: 592.5 mg | INTRAVENOUS | @ 20:00:00 | Stop: 2017-08-29

## 2017-08-29 MED ADMIN — ACETAMINOPHEN 325 MG PO TABS: 650 mg | ORAL | @ 20:00:00 | Stop: 2017-08-29

## 2017-08-29 MED ADMIN — DIPHENHYDRAMINE HCL 50 MG/ML IJ SOLN: 50 mg | INTRAVENOUS | @ 20:00:00 | Stop: 2017-08-29

## 2017-08-29 NOTE — Patient Instructions
Call me when you get the Revlimid.

## 2017-08-29 NOTE — Addendum Note
Addended by: Lamount Cohen on: 08/29/2017 11:43 AM     Modules accepted: Orders

## 2017-08-29 NOTE — Progress Notes
HISTORY OF PRESENT ILLNESS:  Had the pleasure of seeing Annette Hatfield today in the office to help coordinate ongoing care regarding her interesting history of lymphoma.  We did review all of her history insert roughly 1 hr together.  She is now 77 years old and has generally been in excellent health.  Roughly 1 year ago she started having some symptoms of a mass in the right nasal area.  This was biopsied and found to be diffuse large B-cell lymphoma.  Apparently there was 1 other submandibular node but no other evidence of disease.  She consequently received 3 cycles of chemotherapy.  That started with R-chop, but the Adriamycin was changed to Gemzar due to some cardiac issues.  She completed 3 cycles and then received involved field radiation.  She did have follow-up PET scans that were negative for any disease.  However more recently she noted a lesion on her right inner leg area.  He was mainly subcutaneous.  It was felt likely to be benign, however given the persistence and slight growth, this was biopsied last month.  Surprisingly does show evidence of diffuse large B-cell lymphoma.  At the same time she had another lesion on the left upper thigh that was biopsied also looks consistent with lymphoma.  Last PET scan did comment on the lesion in her right leg but no other obvious sites of disease.    Throughout all this she has been doing very well physically.  She did develop some neuropathy likely from the vincristine and the cardiac issues resolve.    Her main complicating factors at her husband is quite ill with Parkinson's disease and dementia.  His currently been living at a nursing home close to home so she is all alone at home.  Does have a very supportive family.    PAST MEDICAL HISTORY:    Primarily as above.  MEDICATIONS:    Estrogen patch.  She also takes multivitamins.  PAST SURGICAL HISTORY:    TAH/BSO, cholecystectomy, appendectomy, knee surgery.  ALLERGIES:  Sulfa drugs. FAMILY HISTORY:     Non contributory.  SOCIAL HISTORY:    As above.  She lives in [place] currently alone.  Dealing with her husband's serious health issues.  Occasional wine use no tobacco use.  REVIEW OF SYSTEMS:    HEENT: No headaches or blurry vision.  CARDIAC: No chest pain or arrhythmias.  PULMONARY:  No shortness of breath or cough.  GASTROINTESTINAL: No weight loss or early satiety.  GENITOURINARY: No hematuria or dysuria.  MUSCULOSKELETAL:  No back pain or muscle aches.  NEUROLOGIC:  No stroke or TIA.  Neuropathy as above.  Has not been persistent.  He  CONSTITUTIONAL:  No fevers, chills, or night sweats.  ENDOCRINOLOGIC: No thyroid disease or diabetes.  HEMATOLOGIC:  No anemia or thrombosis.  CUTANEOUS: No skin cancers or rashes.  PSYCHIATRIC: No depression.  PHYSICAL EXAMINATION: Well appearing, no distress. VSS.  HEENT: NC/AT. PERRL. EOMI. Sclerae anicteric. The mouth has no oral lesions or thrush.   NECK: No thyroid nodules or thyromegaly. No adenopathy.  LYMPH NODES: No epitrochlear, axillary, cervical, or supraclavicular nodes. No adenopathy.  HEART: RRR, with normal S1 and S2.  LUNGS:  Clear to auscultation and percussion bilaterally.   ABDOMEN:  Soft with positive bowel sounds. No distention or tenderness, no hepatosplenomegaly.   EXTREMITIES:  No C/C/E.   NEUROLOGIC:  Nonfocal.  SKIN:   in the right inner thigh there is a healing scar with a subcutaneous nodule  underlying it.  Smaller but similar lesion left upper thigh.  PSYCHIATRIC:  Alert and oriented x3, with appropriate mood and affect.     Imaging and pathology as above.     ASSESSMENT AND PLAN:     This is an extremely interesting and challenging case of a patient who presented with a 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  She did have a complete response to modified chemotherapy and radiation.  Unfortunately within a year she developed some small subcutaneous nodules that are consistent with recurrent DLBCL. They are not affecting her clinically and her PET scan is otherwise unrevealing, but clearly this does imply recurrent disease.  We did have a lengthy discussion today regarding treatment options.  The only potentially curative modality would be salvage chemotherapy followed by stem cell transplant.  This is a very intensive program with some significant morbidities, and she has already decided that is not appropriate.  She does understand again that is the only chance for long-term cure but does want to focus on her short-term issues and quality of life.    With that in mind it does seem reasonable to offer other treatment, understanding it may not be curative in intent.  Has been considered to use Rituxan and Revlimid, the so called R squared regimen.  This does seem appropriate.  I did point out the Revlimid can cause fairly significant toxicities but most people can work through it.  After much discussion will start a slightly modified dose of 10 mg of the Revlimid, and start the Rituxan today.  At this point I am not sure radiation to the skin areas is warranted but will follow and if they are not responding that could be a consideration, again understanding would be palliative in intent.    She and her granddaughter asked many astute questions and they seem well-versed on the nature of her disease.  We will thus get her started on Revlimid and Rituxan likely repeat a PET scan in the next 2 months.      Annette Moh Nicholes Rough, MD

## 2017-08-30 MED ORDER — LENALIDOMIDE 10 MG PO CAPS
10 mg | Freq: Every day | ORAL | 0 refills | Status: AC
Start: 2017-08-30 — End: 2017-09-14

## 2017-08-30 MED ADMIN — RITUXIMAB (RITUXAN) INFUSION 500 ML: 592.5 mg | INTRAVENOUS | @ 01:00:00 | Stop: 2017-08-29

## 2017-08-30 NOTE — Nursing Note
Pt is here to start new regimen- Rituxan/ Revlimid regimen. Pt had consult with MD before treatment.see nMD nores.  Pt did received R-CHOP a year ago.  PIV started, labs drawn per orders. Pre meds given.  Rituxan started at initial rate of 50 mg/ml rate escaledt to 50 mg/hr every 30 min to max rate of 400 mg. Pt tolerated treatment well. P IV removed.Pt discharged in stable condition accompanied by her granddtr. Pt is scheduled in 4 weeks for next treatment.

## 2017-09-01 ENCOUNTER — Telehealth: Payer: MEDICARE

## 2017-09-01 MED ORDER — ONDANSETRON HCL 8 MG PO TABS
8 mg | ORAL_TABLET | Freq: Three times a day (TID) | ORAL | 3 refills | Status: AC | PRN
Start: 2017-09-01 — End: ?

## 2017-09-01 NOTE — Telephone Encounter
Patient wants medication for nausea just incase the Revlimid makes her nauseated. She also wanted to know if she should have genetic testing done.

## 2017-09-01 NOTE — Telephone Encounter
I called patient and informed her that her Zofran had been forwarded to [facility] pharmacy and I also let her know that Dr. Salome Arnt saw no reason for the genetic testing.

## 2017-09-05 ENCOUNTER — Ambulatory Visit: Payer: MEDICARE

## 2017-09-06 ENCOUNTER — Telehealth: Payer: MEDICARE

## 2017-09-06 NOTE — Telephone Encounter
Spoke with Stanton Kidney. Due to family coming into town to make some important decisions about the care of her ill husband, pt would like to start taking the Revlimid on 09/11/17 since she does not know how this would make her feel and wishes to be able to participate in the family affairs before starting. Dr Salome Arnt in agreement to start Revlimid on 09/11/17. Rituxan dose on 09/25/17 as already scheduled and repeat Rituxan in 2 weeks from the 11th.

## 2017-09-06 NOTE — Telephone Encounter
Spoke to pt, will start the Revlimid 10mg . Will have her come in in 2 weeks for labs and next dose of Rituxan.    She is on estrogen patch - discussed DVT risk - she will try to wean off the patch, and start a daily aspirin.

## 2017-09-06 NOTE — Telephone Encounter
Dr. Salome Arnt,    Pt called, stated she received her Revlimid. Would like to know what the next step is.     Aware you would get the message in the afternoon.  Ph# [phone]

## 2017-09-13 MED ORDER — REVLIMID 10 MG PO CAPS
ORAL_CAPSULE | 2 refills | Status: AC
Start: 2017-09-13 — End: 2017-10-13

## 2017-09-22 ENCOUNTER — Ambulatory Visit: Payer: BLUE CROSS/BLUE SHIELD

## 2017-09-22 ENCOUNTER — Ambulatory Visit: Payer: MEDICARE

## 2017-09-25 ENCOUNTER — Ambulatory Visit: Payer: BLUE CROSS/BLUE SHIELD | Attending: Hematology & Oncology

## 2017-09-25 DIAGNOSIS — D61810 Antineoplastic chemotherapy induced pancytopenia: Secondary | ICD-10-CM

## 2017-09-25 DIAGNOSIS — T451X5A Adverse effect of antineoplastic and immunosuppressive drugs, initial encounter: Secondary | ICD-10-CM

## 2017-09-25 DIAGNOSIS — K1232 Oral mucositis (ulcerative) due to other drugs: Secondary | ICD-10-CM

## 2017-09-25 DIAGNOSIS — D708 Other neutropenia: Secondary | ICD-10-CM

## 2017-09-25 DIAGNOSIS — R197 Diarrhea, unspecified: Secondary | ICD-10-CM

## 2017-09-25 DIAGNOSIS — C8330 Diffuse large B-cell lymphoma, unspecified site: Secondary | ICD-10-CM

## 2017-09-25 MED ADMIN — RITUXIMAB (RITUXAN) RAPID INFUSION 500 ML: 592.5 mg | INTRAVENOUS | @ 20:00:00 | Stop: 2017-09-25

## 2017-09-25 MED ADMIN — DIPHENHYDRAMINE HCL 50 MG/ML IJ SOLN: 50 mg | INTRAVENOUS | @ 19:00:00 | Stop: 2017-09-25

## 2017-09-25 MED ADMIN — ACETAMINOPHEN 325 MG PO TABS: 650 mg | ORAL | @ 19:00:00 | Stop: 2017-09-25

## 2017-09-25 NOTE — Progress Notes
Mrs. 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  She recently started therapy with Revlimid and Rituxan.  Started at a modified dose of 10 mg daily.  She did develop diarrhea roughly 10 days into the treatment.  She held it 2 days ago and the diarrhea has improved.  Otherwise she is doing fairly well.  Does note the subcutaneous skin nodules have already regressed significantly.  She has been eating and drinking normally with no significant weight loss.    PHYSICAL EXAMINATION: Patient looks well.  VSS. Weight is stable.   HEENT: Sclerae anicteric.  LYMPH NODES: No epitrochlear, axillary, cervical, or supraclavicular nodes.   HEART: RRR, normal S1 & S2.  LUNGS: CTAB.  ABDOMEN: Benign with no organomegaly.  EXTREMITIES: No edema.   SKIN: No rash, petechiae, ecchymosis.  The subcutaneous nodules are markedly diminished.  NEUROLOGIC: Nonfocal.    LABS:  pending.    ASSESSMENT AND PLAN      Problem 1. Lymphoma:  She initially received standard therapy but has progressed.  She consequently was started on a somewhat unconventional approach of Revlimid and Rituxan.  We did modify the dose of Revlimid and she has been off  for the last few days due to diarrhea I did tell her that if she continues to have no diarrhea for the next 2 days she can start back the Revlimid, but will start on an every-other-day schedule of 10 mg. Will likely continue this scheduled to see how she tolerates it and use Imodium as needed for the diarrhea.  It is quite striking that this already been some response.  Will proceed with Rituxan today.    Given the potential DVT risk she has been off of her estrogen patch and I did ask her start on a baby aspirin.    [name], MD

## 2017-09-25 NOTE — Nursing Note
Annette Hatfield, 77 y.o. female is here for chemotherapy infusion of: Lenalidomide / Rituximab   Cycle: 2 , Day: 1  S:  I started the Revlimid on Sunday 2/24. I took it up to Friday 3/8/ when I Called the doctor because of diarrhea and he told me to stop taking it''    O: Overall tolerating treatment well. Pt stated she has already seen improvement in the size of her nodules. Did have to stop the Revlimid due to diarrhea, which has since his morning has had no episodes of diarrhea. Was taking Imodium for the diarrhea. Does feel well today to proceed today with treatment.   Lab Results   Component Value Date    WBC 13.3 (H) 09/25/2017    HGB 12.0 09/25/2017    HCT 37.4 09/25/2017    MCV 99.7 (H) 09/25/2017    PLT 264 09/25/2017     @LASTANC @ 10.36 Today  @LASTCR @  BP Readings from Last 1 Encounters:   09/25/17 110/72         A:  Encounter Diagnoses   Name Primary?    Pancytopenia due to antineoplastic chemotherapy (HCC/RAF) Yes    Neutropenia associated with mucositis due to antineoplastic therapy (HCC/RAF)     Large cell (diffuse) non-Hodgkin's lymphoma (HCC/RAF)       Meets parameters to proceed with treatment.    P:    IV device 24 angio cath in left antecubital vein  IV therapy site and patency: Patent with good blood return noted throughout infusion    Premeds given as ordered. See MAR  Chemotherapy administered rituximab 592 mg given IVPB by variable rate. 8 mg wasted  See Mar     Tolerated infusion with out incident.  IV removed. Pressure dressing applied.     reinforced instructions per Dr Salome Arnt to take Revlimid on Wednesday , Friday, and Sunday of this week. Then hold until next Rituxan dose on 10/09/17. Appointment given.

## 2017-09-25 NOTE — Patient Instructions
Don't take any revlimid until Wed. If no diarrhea off Imodium, take it every other day until Monday.  Then stop until next dose of Rituxan

## 2017-09-26 LAB — Lactate Dehydrogenase: LACTATE DEHYDROGENASE: 173 U/L (ref 125–256)

## 2017-09-26 LAB — Comprehensive Metabolic Panel
CALCIUM: 9.2 mg/dL (ref 8.6–10.4)
TOTAL PROTEIN: 6.2 g/dL (ref 6.1–8.2)

## 2017-10-02 ENCOUNTER — Ambulatory Visit: Payer: MEDICARE

## 2017-10-06 ENCOUNTER — Ambulatory Visit: Payer: MEDICARE

## 2017-10-06 ENCOUNTER — Ambulatory Visit: Payer: BLUE CROSS/BLUE SHIELD

## 2017-10-09 ENCOUNTER — Ambulatory Visit: Payer: BLUE CROSS/BLUE SHIELD | Attending: Hematology & Oncology

## 2017-10-09 ENCOUNTER — Ambulatory Visit: Payer: MEDICARE | Attending: Hematology & Oncology

## 2017-10-09 DIAGNOSIS — C8330 Diffuse large B-cell lymphoma, unspecified site: Secondary | ICD-10-CM

## 2017-10-09 DIAGNOSIS — T451X5A Adverse effect of antineoplastic and immunosuppressive drugs, initial encounter: Secondary | ICD-10-CM

## 2017-10-09 DIAGNOSIS — K1232 Oral mucositis (ulcerative) due to other drugs: Secondary | ICD-10-CM

## 2017-10-09 DIAGNOSIS — R197 Diarrhea, unspecified: Secondary | ICD-10-CM

## 2017-10-09 DIAGNOSIS — D708 Other neutropenia: Secondary | ICD-10-CM

## 2017-10-09 DIAGNOSIS — D61810 Antineoplastic chemotherapy induced pancytopenia: Secondary | ICD-10-CM

## 2017-10-09 LAB — Comprehensive Metabolic Panel
CALCIUM: 9.7 mg/dL (ref 8.6–10.4)
TOTAL PROTEIN: 6.7 g/dL (ref 6.1–8.2)

## 2017-10-09 LAB — Lactate Dehydrogenase: LACTATE DEHYDROGENASE: 180 U/L (ref 125–256)

## 2017-10-09 MED ADMIN — DIPHENHYDRAMINE HCL 50 MG/ML IJ SOLN: 50 mg | INTRAVENOUS | @ 19:00:00 | Stop: 2017-10-09

## 2017-10-09 MED ADMIN — RITUXIMAB (RITUXAN) RAPID INFUSION 500 ML: 592.5 mg | INTRAVENOUS | @ 19:00:00 | Stop: 2017-10-09

## 2017-10-09 MED ADMIN — ACETAMINOPHEN 325 MG PO TABS: 650 mg | ORAL | @ 19:00:00 | Stop: 2017-10-09

## 2017-10-09 NOTE — Progress Notes
Patient name:  Annette Hatfield  MRN:  [id number]  DOB:  Mar 11, 1941  CSN: [id number]  Date of encounter: 10/09/2017  Provider: Londell Moh. 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 She returns for her 3rd dose of Rituxan that she has been receiving along with Revlimid. She did stop the revlimid a few days early due to severe diarrhea. She continues to have loose stools daily, more toward the end of the day, but there is improvement with no further cramping. She does take imodium on occasion. She has also been receiving care through her naturopath and explains that she has been receiving infusion of Artesunate, and antimalarial drug that has shown some activity in B-cell lymphomas. Since starting on the Revlimid and Rituxan her subcutaneous skin nodules have clinically resolved. She has been eating and drinking well. She continues to deal with the stress of her husband who is quite ill.     ECOG performance status: 0    Patient Active Problem List    Diagnosis Date Noted   ??? Diarrhea 09/25/2017     revlimid     ??? Skin lesion 06/30/2017   ??? Fatigue 10/28/2016   ??? Neuropathy 10/28/2016   ??? Pancytopenia due to antineoplastic chemotherapy (HCC/RAF) 08/09/2016   ??? Neutropenia associated with mucositis due to antineoplastic therapy (HCC/RAF) 08/09/2016   ??? Large cell (diffuse) non-Hodgkin's lymphoma (HCC/RAF) 08/05/2016   ??? Postmenopausal HRT (hormone replacement therapy) 03/26/2013   ??? Vaginal atrophy 03/26/2013   ??? S/P TAH-BSO 03/26/2013   ??? Cystocele 03/26/2013       Objective:  Current medications  No outpatient prescriptions have been marked as taking for the 10/09/17 encounter (Appointment) with Lucinda Dell., MD.       Allergies:  Allergies   Allergen Reactions   ??? Sulfa Antibiotics        Review of Systems:  14 item Normal ROS Constitutional: No fevers, chills, weight loss or appetite changes.   Eyes: No recent blurry vision, double vision or visual changes. ENT: No recent sinus pain or congestion. No sore throat or mouth sores.   Neck: No neck pain or stiffness.  Cardiovascular: No chest pain or pressure, no palpitations.   Pulmonary: No shortness of breath, no cough or wheezing.   Gastrointestinal: No abdominal pain, nausea, vomiting. + diarrhea.  No melena or BRBPR.   Genitourinary: No urinary frequency, urgency, or dysuria.   Musculoskeletal: No joint pain, muscle pain or back pain.   Neurologic: No headaches. No numbness, tingling or weakness.   Dermatologic: No rash, pruritus or recent skin changes.   Hematologic: No abnormal bruising or bleeding.   Endocrine: No polyuria, polydipsia, heat or cold intolerance.   Psychiatric: No depressive symptoms, no suicidal or homicidal ideation.       Vitals:  Wt Readings from Last 3 Encounters:   10/09/17 55.3 kg (122 lb)   09/25/17 57.4 kg (126 lb 9.6 oz)   08/29/17 55.8 kg (123 lb)     Temp Readings from Last 3 Encounters:   10/09/17 37 ???C (98.6 ???F) (Tympanic)   09/25/17 37.3 ???C (99.1 ???F) (Tympanic)   09/25/17 37.3 ???C (99.2 ???F) (Tympanic)     BP Readings from Last 3 Encounters:   10/09/17 120/78   09/25/17 110/72   09/25/17 114/71     Pulse Readings from Last 3 Encounters:   10/09/17 88   09/25/17 82   08/29/17 94  Physical Exam:  General: Appears well-developed, well-nourished and close to stated age.   Head: Normocephalic, atraumatic.  Eyes: PERRL without icterus.   ENT: Oropharynx is clear, mucus membranes are moist.    CV: Regular in rate and rhythm, no murmurs or gallops.   Chest: Clear to auscultation bilaterally without wheezing or rhonchi.  Respiratory effort appears normal.   Abdomen: Soft, nontender and nondistended. Bowel sounds are present and normoactive.   Musculoskeletal: No edema. No cyanosis. Extremities are warm and well-perfused.   Hematologic: No bruising, purpura or petechiae are noted.   Dermatologic: No rashes appreciated. The subcutaneous nodules have clinically resolved. Psychiatric: Affect appropriate.  Pleasant and conversant.     Recent Labs:  Lab Results   Component Value Date    WBC 9.9 10/09/2017    HGB 12.8 10/09/2017    HCT 39.2 10/09/2017    MCV 96.6 (H) 10/09/2017    PLT 443 (H) 10/09/2017    NEUTPCT 81.8 (H) 10/09/2017    NEUTABS 8.07 (H) 10/09/2017    MONOPCT 5.7 10/09/2017    MONOABS 0.56 10/09/2017     Lab Results   Component Value Date    NA 138 09/25/2017    K 4.4 09/25/2017    CL 99 09/25/2017    CO2 23 09/25/2017    CREAT 0.98 09/25/2017    BUN 13 09/25/2017    GLUCOSE 122 (H) 09/25/2017    CALCIUM 9.2 09/25/2017    PHOS 3.0 08/29/2017     Lab Results   Component Value Date    TOTPRO 6.2 09/25/2017    ALBUMIN 4.3 09/25/2017    BILITOT 0.6 09/25/2017    AST 28 09/25/2017    ALT 29 09/25/2017    ALKPHOS 65 09/25/2017       Treatment Plan       Pertinent Imaging/Pathology:    Impression and Recommendations:  Diagnoses and all orders for this visit:    Diffuse large B cell lymphoma:  She initially received standard therapy but has progressed.  She consequently was started on a somewhat unconventional approach of Revlimid and Rituxan.  We did modify the dose of Revlimid to 10 mg daily. She again had issues with diarrhea and has been off for 10 days. She is steadily doing better, but still with some diarrhea. She understands that once she is doing better, we will plan to start back on the Revlimid but will start on an every-other-day schedule of 10 mg. She has already had a significant clinical response with resolution of the subcutaneous nodules. Will proceed with Rituxan today.  She has been receiving an antimalarial drug, Artesunate, with her naturopath that has been shown to have some activity with B cell lymphomas. She did start treatment 6 weeks prior to Rituxan. We have advised that she discontinue and continue with therapy as above. She is concerned about stopping the Artesunate and plans to continue. She does understand that we do not have data in using all 3 drugs together and there may be toxicities we are unaware of.    ???    Diarrhea: Likely r/t Revlimid. She still struggled with the 10 mg dose and continues to c/o loose stools daily, but improving. She will continue imodium as needed. We have recommended she start back on Revlimid 10 mg dose QOD once diarrhea has improved.            Return to clinic:  4 weeks        Attending Physician: Londell Moh.  Nicholes Rough, MD.  Thereasa Parkin: Stann Mainland. Velda Shell, NP       I have independently reviewed this patient's medications, and current issues, and formulated the plan in conjunction with the nurse practitioner.  The note has been reviewed by me and reflects the nature of our visit and accurately defines our plan.  Will hope to keep on Revlimid at reasonable dose - monitoring degree of diarrhea.Marium Ragan

## 2017-10-09 NOTE — Patient Instructions
Start back on Revlimid 10 mg every other day once diarrhea has improved. Call with any issues of concern.

## 2017-10-09 NOTE — Nursing Note
Pt is her for treatment. Pt stopped Revlimiv due to severe diarrhea.   Sharyn Lull, NP  visited and assessed pt.  See NP notes.  PIV started , labs drawn per orders. Pre meds given.  Rituxan 592 mg given without incident.  PIV removed, pressure drsg applied. Pt dischared to self in stable condition.  Pt is schedule in 04/22 for next tretment.

## 2017-10-12 ENCOUNTER — Telehealth: Payer: BLUE CROSS/BLUE SHIELD

## 2017-10-12 MED ORDER — LENALIDOMIDE 10 MG PO CAPS
10 mg | ORAL_CAPSULE | Freq: Every day | ORAL | 2 refills | Status: AC
Start: 2017-10-12 — End: 2017-11-07

## 2017-10-12 NOTE — Telephone Encounter
Advised pt last visit to take 10 mg every other day once diarrhea had stopped. She said that she still had some pills left. Prescription sent today for 10 mg daily in the event that we increase back to daily.

## 2017-10-12 NOTE — Telephone Encounter
Annette Hatfield,     Pt stopped by the office, wanted to follow-up on her Revlimid RX. Would like instructions as to what she needs to do next.     [facility]    Ph# [phone]

## 2017-11-03 ENCOUNTER — Ambulatory Visit: Payer: BLUE CROSS/BLUE SHIELD

## 2017-11-06 ENCOUNTER — Ambulatory Visit: Payer: BLUE CROSS/BLUE SHIELD | Attending: Hematology & Oncology

## 2017-11-06 ENCOUNTER — Ambulatory Visit: Payer: MEDICARE | Attending: Hematology & Oncology

## 2017-11-06 DIAGNOSIS — R197 Diarrhea, unspecified: Secondary | ICD-10-CM

## 2017-11-06 DIAGNOSIS — T451X5A Adverse effect of antineoplastic and immunosuppressive drugs, initial encounter: Secondary | ICD-10-CM

## 2017-11-06 DIAGNOSIS — C8330 Diffuse large B-cell lymphoma, unspecified site: Secondary | ICD-10-CM

## 2017-11-06 DIAGNOSIS — K1232 Oral mucositis (ulcerative) due to other drugs: Secondary | ICD-10-CM

## 2017-11-06 DIAGNOSIS — D708 Other neutropenia: Secondary | ICD-10-CM

## 2017-11-06 DIAGNOSIS — D61810 Antineoplastic chemotherapy induced pancytopenia: Secondary | ICD-10-CM

## 2017-11-06 LAB — Lactate Dehydrogenase: LACTATE DEHYDROGENASE: 153 U/L (ref 125–256)

## 2017-11-06 LAB — Comprehensive Metabolic Panel
BILIRUBIN,TOTAL: 0.6 mg/dL (ref 0.1–1.2)
TOTAL PROTEIN: 6.8 g/dL (ref 6.1–8.2)

## 2017-11-06 MED ORDER — LENALIDOMIDE 10 MG PO CAPS
10 mg | ORAL_CAPSULE | Freq: Every day | ORAL | 2 refills | Status: AC
Start: 2017-11-06 — End: 2018-01-02

## 2017-11-06 MED ADMIN — DIPHENHYDRAMINE HCL 50 MG/ML IJ SOLN: 50 mg | INTRAVENOUS | @ 17:00:00 | Stop: 2017-11-06

## 2017-11-06 MED ADMIN — RITUXIMAB (RITUXAN) RAPID INFUSION 500 ML: 592.5 mg | INTRAVENOUS | @ 18:00:00 | Stop: 2017-11-06

## 2017-11-06 MED ADMIN — ACETAMINOPHEN 325 MG PO TABS: 650 mg | ORAL | @ 17:00:00 | Stop: 2017-11-06

## 2017-11-06 NOTE — Nursing Note
Pt is here today for treatment- Rituxan C4. Pt is feeling well. Pt was seen first by Dr.Applebaum before treatment.  See D notes.    PIV started labs drawn per orders. Rituxan 592 mg started at 50 mg/hr and dose escalated every 30 min to max rate og 400 mg/hr.  Pt tolerated treatment well PIV removed, pressure drsg applied. COnfirmed next appt.  Ptt discharged to home in stable condition.

## 2017-11-06 NOTE — Patient Instructions
Check on the revlimid - should be at pharmacy

## 2017-11-06 NOTE — Progress Notes
Annette Hatfield returns for management of her recurrent lymphoma.  The plan was to treat her with Rituxan and Revlimid.  Have held the Revlimid and decreased the dose due to diarrhea.  She has also incorporated some holistic measures including antimalarial drugs.  The subcutaneous nodules have resolved.  She is feeling quite well.  She has had no further diarrhea.    Husband who is quite ill is now living [place].  This has been an area of stress.    PHYSICAL EXAMINATION: Patient looks well.  VSS. Weight is stable.   HEENT: Sclerae anicteric.  LYMPH NODES: No epitrochlear, axillary, cervical, or supraclavicular nodes.   HEART: RRR, normal S1 & S2.  LUNGS: CTAB.  ABDOMEN: Benign with no organomegaly.  EXTREMITIES: No edema.   SKIN: No rash, petechiae, ecchymosis. Subq nodules have resolved.  NEUROLOGIC: Nonfocal.    LABS:  pending.    ASSESSMENT AND PLAN      Problem 1. Lymphoma:  Plan was to treat Revlimid and Rituxan.  She received the Revlimid for a short period of time at the full dose but had to hold due to diarrhea.  She has had a remarkable response in terms of subcutaneous nodules.  Now with a modified dose and holding the Revlimid she is doing quite well.  There has been no further diarrhea.  Subcutaneous nodules have completely regressed.    At this point will continue on the Rituxan on a monthly basis.  Will try to incorporate the Revlimid, at an alternate dose of 10 mg every other day, try to escalate up to 10 mg daily if tolerated.  She will continue other supportive measures.    [name], MD

## 2017-11-17 ENCOUNTER — Telehealth: Payer: BLUE CROSS/BLUE SHIELD

## 2017-11-17 NOTE — Telephone Encounter
Arlana Pouch from [facility] called, stated they need auth number for Revlimid.    Call back # [phone] ext [phone]    Thank you,  Paulita Cradle

## 2017-11-17 NOTE — Telephone Encounter
Reply by: Boris Sharper Kara Mierzejewski  Called [facility], spoke to Mervin Hack # [phone]. Revlimed is scheduled to ship out tomorrow.

## 2017-12-04 ENCOUNTER — Ambulatory Visit: Payer: MEDICARE

## 2017-12-04 ENCOUNTER — Ambulatory Visit: Payer: BLUE CROSS/BLUE SHIELD

## 2017-12-05 ENCOUNTER — Ambulatory Visit: Payer: BLUE CROSS/BLUE SHIELD | Attending: Hematology & Oncology

## 2017-12-05 ENCOUNTER — Telehealth: Payer: MEDICARE

## 2017-12-05 DIAGNOSIS — C8330 Diffuse large B-cell lymphoma, unspecified site: Secondary | ICD-10-CM

## 2017-12-05 DIAGNOSIS — T451X5A Adverse effect of antineoplastic and immunosuppressive drugs, initial encounter: Secondary | ICD-10-CM

## 2017-12-05 DIAGNOSIS — K1232 Oral mucositis (ulcerative) due to other drugs: Secondary | ICD-10-CM

## 2017-12-05 DIAGNOSIS — D61810 Antineoplastic chemotherapy induced pancytopenia: Secondary | ICD-10-CM

## 2017-12-05 DIAGNOSIS — D708 Other neutropenia: Secondary | ICD-10-CM

## 2017-12-05 MED ADMIN — DIPHENHYDRAMINE HCL 50 MG/ML IJ SOLN: 50 mg | INTRAVENOUS | @ 17:00:00 | Stop: 2017-12-05

## 2017-12-05 MED ADMIN — ACETAMINOPHEN 325 MG PO TABS: 650 mg | ORAL | @ 17:00:00 | Stop: 2017-12-05

## 2017-12-05 MED ADMIN — RITUXIMAB (RITUXAN) RAPID INFUSION 500 ML: 592.5 mg | INTRAVENOUS | @ 18:00:00 | Stop: 2017-12-05

## 2017-12-05 NOTE — Nursing Note
Pt presents here today for treatment-Rituxan C5. Pt is feeling well today. Pt states that she is not taking Revlimid . Sharyn Lull, NP notified.  Sharyn Lull , NP visited and assessed pt.  PIV started, labs drawn per orders.  Pre meds given.  Rituxan 592 mg given at variable rate.  Pt tolerated treatment well. PIV removed, pressure drgs applied. Pt discharged to self in stable condition. Confirmed appt for next treatment.

## 2017-12-05 NOTE — Telephone Encounter
Online- sent order to [facility] for PET scan

## 2017-12-05 NOTE — Patient Instructions
Call with any issues of concern.

## 2017-12-05 NOTE — Progress Notes
Patient name:  Annette Hatfield  MRN:  [id number]  DOB:  [birth year]  CSN: [id number]  Date of encounter: 12/05/2017  Provider: Londell Moh. 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 She returns for her 5th dose of Rituxan that she was initially receiving along with Revlimid. She did stop the revlimid when she was having severe diarrhea. She is now having normal bowel movements, however has decided not to go back on the Revlimid at this time. She has also been receiving alternative care through her naturopath and explains that she has been receiving infusion of Artesunate, and antimalarial drug that has shown some activity in B-cell lymphomas along with vitamin C infusions. Since starting on the Revlimid and Rituxan her subcutaneous skin nodules have clinically resolved and have not recurred. She has no complaints today and reports she is feeling very well. No new skin issues, f/c, ns. She continues to deal with the stress of her husband who is quite ill and is now in a facility in [place] near his daughter.     ECOG performance status: 0    Patient Active Problem List    Diagnosis Date Noted   ??? Diarrhea 09/25/2017     revlimid     ??? Skin lesion 06/30/2017   ??? Fatigue 10/28/2016   ??? Neuropathy 10/28/2016   ??? Pancytopenia due to antineoplastic chemotherapy (HCC/RAF) 08/09/2016   ??? Neutropenia associated with mucositis due to antineoplastic therapy (HCC/RAF) 08/09/2016   ??? Large cell (diffuse) non-Hodgkin's lymphoma (HCC/RAF) 08/05/2016   ??? Postmenopausal HRT (hormone replacement therapy) 03/26/2013   ??? Vaginal atrophy 03/26/2013   ??? S/P TAH-BSO 03/26/2013   ??? Cystocele 03/26/2013       Objective:  Current medications  No outpatient prescriptions have been marked as taking for the 12/05/17 encounter (Appointment) with Lucinda Dell., MD.       Allergies:  Allergies   Allergen Reactions   ??? Sulfa Antibiotics        Review of Systems: 14 item Normal ROS Constitutional: No fevers, chills, weight loss or appetite changes.   Eyes: No recent blurry vision, double vision or visual changes.  ENT: No recent sinus pain or congestion. No sore throat or mouth sores.   Neck: No neck pain or stiffness.  Cardiovascular: No chest pain or pressure, no palpitations.   Pulmonary: No shortness of breath, no cough or wheezing.   Gastrointestinal: No abdominal pain, nausea, vomiting. No diarrhea.  No melena or BRBPR.   Genitourinary: No urinary frequency, urgency, or dysuria.   Musculoskeletal: No joint pain, muscle pain or back pain.   Neurologic: No headaches. No numbness, tingling or weakness.   Dermatologic: No rash, pruritus or recent skin changes.   Hematologic: No abnormal bruising or bleeding.   Endocrine: No polyuria, polydipsia, heat or cold intolerance.   Psychiatric: No depressive symptoms, no suicidal or homicidal ideation.       Vitals:  Wt Readings from Last 3 Encounters:   12/05/17 57.2 kg (126 lb)   11/06/17 56.3 kg (124 lb 3.2 oz)   11/06/17 56.3 kg (124 lb 3.2 oz)     Temp Readings from Last 3 Encounters:   12/05/17 36.7 ???C (98 ???F) (Tympanic)   11/06/17 36.3 ???C (97.3 ???F) (Tympanic)   11/06/17 36.3 ???C (97.3 ???F) (Tympanic)     BP Readings from Last 3 Encounters:   12/05/17 132/65   11/06/17 122/70   11/06/17 122/70  Pulse Readings from Last 3 Encounters:   12/05/17 78   11/06/17 58   11/06/17 58        Physical Exam:  General: Appears well-developed, well-nourished and close to stated age.   Head: Normocephalic, atraumatic.  Eyes: PERRL without icterus.   ENT: Oropharynx is clear, mucus membranes are moist.    CV: Regular in rate and rhythm, no murmurs or gallops.   Chest: Clear to auscultation bilaterally without wheezing or rhonchi.  Respiratory effort appears normal.   Abdomen: Soft, nontender and nondistended. Bowel sounds are present and normoactive.   Musculoskeletal: No edema. No cyanosis. Extremities are warm and well-perfused. Hematologic: No bruising, purpura or petechiae are noted.   Dermatologic: No rashes appreciated. The subcutaneous nodules have clinically resolved.   Lymph nodes: No palpable cervical, SC, axillary adenopathy.   Psychiatric: Affect appropriate.  Pleasant and conversant.     Recent Labs:  Lab Results   Component Value Date    WBC 6.6 12/05/2017    HGB 12.8 12/05/2017    HCT 40.1 12/05/2017    MCV 96.9 (H) 12/05/2017    PLT 234 12/05/2017    NEUTPCT 76.5 (H) 12/05/2017    NEUTABS 5.09 12/05/2017    MONOPCT 8.6 12/05/2017    MONOABS 0.57 12/05/2017     Lab Results   Component Value Date    NA 143 11/06/2017    K 4.5 11/06/2017    CL 103 11/06/2017    CO2 26 11/06/2017    CREAT 0.79 11/06/2017    BUN 15 11/06/2017    GLUCOSE 121 (H) 11/06/2017    CALCIUM 10.1 11/06/2017    PHOS 3.0 08/29/2017     Lab Results   Component Value Date    TOTPRO 6.8 11/06/2017    ALBUMIN 4.7 11/06/2017    BILITOT 0.6 11/06/2017    AST 19 11/06/2017    ALT 21 11/06/2017    ALKPHOS 64 11/06/2017       Treatment Plan       Pertinent Imaging/Pathology:    Impression and Recommendations:  Diagnoses and all orders for this visit:    Diffuse large B cell lymphoma:  Pt who presented with a 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  She did have a complete response to modified chemotherapy and radiation.  Unfortunately within a year she developed some small subcutaneous nodules that are consistent with recurrent DLBCL. Her PET scan is otherwise unrevealing. She consequently was started on a somewhat unconventional approach of Revlimid and Rituxan. She did quickly have a significant clinical response with resolution of the subcutaneous nodules. She unfortunately has had a difficult time tolerating Revlimid, even with modified dose, d/t diarrhea. Her diarrhea has resolved and we did recommend she start back on the Revlimid at modified dose of 10 mg QOD. She is not willing to start back on the Revlimid at this time. She has been receiving an antimalarial drug, Artesunate, with her naturopath that has been shown to have some activity with B cell lymphomas. She did start treatment 6 weeks prior to Rituxan. She plans to continue the Artesunate and vitamin C infusions. She does understand that we do not have data to support this treatment plan. We will proceed with Rituxan today. We will repeat PET scan prior to next visit and if there is no progression will plan to continue Rituxan.     ???       Return to clinic:  4 weeks  Attending Physician: Londell Moh. Nicholes Rough, MD.  Thereasa Parkin: Stann Mainland. Velda Shell, NP        I have independently reviewed this patient's medications, and current issues, and formulated the plan in conjunction with the nurse practitioner.  The note has been reviewed by me and reflects the nature of our visit and accurately defines our plan.  Pt has chosen to stop Revlimid, will continue Rituxan as long as responding.  Culver Feighner

## 2017-12-06 LAB — Lactate Dehydrogenase: LACTATE DEHYDROGENASE: 161 U/L (ref 125–256)

## 2017-12-06 LAB — Comprehensive Metabolic Panel: ALBUMIN: 4.5 g/dL (ref 3.9–5.0)

## 2017-12-06 MED ORDER — REVLIMID 10 MG PO CAPS
ORAL_CAPSULE
Start: 2017-12-06 — End: ?

## 2017-12-21 ENCOUNTER — Telehealth: Payer: BLUE CROSS/BLUE SHIELD

## 2017-12-21 NOTE — Telephone Encounter
Returned call and spoke to [place]. She is scheduled for her PET scan on 6/11 however was having second thoughts given risk of dye and radiation. We did discuss at length. She does understand the rationale for the scan in helping with making treatment decisions. She agreed that she should proceed with the scan. I offered to have her speak with Dr. Salome Arnt and she did not feel that was necessary. She does have a f/u appt to discuss results with Dr. Salome Arnt on 6/18.

## 2017-12-21 NOTE — Telephone Encounter
Annette Hatfield,      Pt called stated she would like to speak to you regarding next step in treatment. Pt can be reached at ph#[phone].

## 2018-01-01 ENCOUNTER — Ambulatory Visit: Payer: MEDICARE

## 2018-01-02 ENCOUNTER — Ambulatory Visit: Payer: MEDICARE | Attending: Hematology & Oncology

## 2018-01-02 ENCOUNTER — Ambulatory Visit: Payer: BLUE CROSS/BLUE SHIELD | Attending: Hematology & Oncology

## 2018-01-02 DIAGNOSIS — K1232 Oral mucositis (ulcerative) due to other drugs: Secondary | ICD-10-CM

## 2018-01-02 DIAGNOSIS — C8330 Diffuse large B-cell lymphoma, unspecified site: Secondary | ICD-10-CM

## 2018-01-02 DIAGNOSIS — D61810 Antineoplastic chemotherapy induced pancytopenia: Secondary | ICD-10-CM

## 2018-01-02 DIAGNOSIS — D708 Other neutropenia: Secondary | ICD-10-CM

## 2018-01-02 DIAGNOSIS — T451X5A Adverse effect of antineoplastic and immunosuppressive drugs, initial encounter: Secondary | ICD-10-CM

## 2018-01-02 LAB — Comprehensive Metabolic Panel
ALANINE AMINOTRANSFERASE: 21 U/L (ref 8–64)
BILIRUBIN,TOTAL: 0.5 mg/dL (ref 0.1–1.2)

## 2018-01-02 LAB — Lactate Dehydrogenase: LACTATE DEHYDROGENASE: 334 U/L — ABNORMAL HIGH (ref 125–256)

## 2018-01-02 MED ADMIN — ACETAMINOPHEN 325 MG PO TABS: 650 mg | ORAL | @ 17:00:00 | Stop: 2018-01-02

## 2018-01-02 MED ADMIN — RITUXIMAB (RITUXAN) RAPID INFUSION 500 ML: 592.5 mg | INTRAVENOUS | @ 20:00:00 | Stop: 2018-01-02

## 2018-01-02 MED ADMIN — DIPHENHYDRAMINE HCL 50 MG/ML IJ SOLN: 50 mg | INTRAVENOUS | @ 17:00:00 | Stop: 2018-01-02

## 2018-01-02 NOTE — Progress Notes
Annette Hatfield returns for ongoing management regarding her history of lymphoma.  Our plan was to treat her Rituxan and Revlimid.  She developed fairly significant toxicity from Revlimid, even low doses, so she stopped that medication.  She has continued on Rituxan infusions.  She tells me she has been feeling excellent.  Energy level has been good.  No pain.  No fevers or chills, night sweats, weight loss.  Her subcutaneous nodules resolved.    PHYSICAL EXAMINATION: Patient looks well.  VSS. Weight is stable.   HEENT: Sclerae anicteric.  LYMPH NODES: No epitrochlear, axillary, cervical, or supraclavicular nodes.   HEART: RRR, normal S1 & S2.  LUNGS: CTAB.  ABDOMEN: Benign with no organomegaly.  EXTREMITIES: No edema.   SKIN: No rash, petechiae, ecchymosis.  NEUROLOGIC: Nonfocal.    Recent PET scan shows no evidence of activity.    ASSESSMENT AND PLAN      Problem 1. Patient with recurrent lymphoma who has had an excellent response to current therapy.  Again she has been receiving primarily Rituxan as she chose to discontinue the Revlimid.  She has been using multiple holistic treatments as well.  Obviously the results are very encouraging.    For now I have recommended she at least continue on the Rituxan infusions.  I did point out that there is a high likelihood of the lymphoma recurring in the future, and the Rituxan may help diminish that risk.  Again she feels strongly against going back on the Revlimid due to toxicities but will continue her other measures.    [name], MD

## 2018-01-02 NOTE — Patient Instructions
Continue current medications.  Call with any problems.

## 2018-01-02 NOTE — Nursing Note
Pt returns for her every four week infusion of Rituxan. Pt visited with Dr Salome Arnt prior to arriving into the infusion area to talk about her recent scan results. The news was very good, the pt is very happy. PIV started, labs drawn and processed. Treatment started with premeds followed by Rituxan titrated at 100 ml every 30 mins to a max rate of 400 ml until completion. Pt tolerated the infusion well. Appointment made for her return visit in four weeks. PIV d/c and pt discharged.   Patient presents for Cycle 6 Day 1 on Rituxan protocol.  Medication Rituxan administered without incident at a dose of 592 mg with 7 mg wasted.

## 2018-01-03 ENCOUNTER — Telehealth: Payer: MEDICARE

## 2018-01-03 NOTE — Telephone Encounter
Hi Annette Hatfield,    Pt called, would like to discuss new symptoms she is experiencing. She came in for tx yesterday and believes they are side effects of the chemo med Rituxan.     CB# [phone]    Thank you,  Olyvia Gopal (Float)

## 2018-01-03 NOTE — Telephone Encounter
Returned call and spoke to Ed[place]. She reports feeling soreness in her mouth with no sores, gastric upset/discomfort and night sweats after receiving Rituxan. She explained that the same happened with her last cycle but she did not connect it. I did explain that her issues are likely r/t Rituxan. For the gastric upset she will start pepcid and for the mouth soreness I did recommend baking soda/salt rinses. She will let us know if night sweats continue. She denies any f/c and is otherwise feeling well. I have encouraged her to call back if needed.   She did also explain that Dr. Delila Pereyra, [facility], was her gynecologist for years and she was shocked when she saw the news. I did ask if she was aware of the number, provided by [facility], for her to call and discuss her feelings/concerns. She does have the number and will likely call.

## 2018-01-04 ENCOUNTER — Ambulatory Visit: Payer: MEDICARE

## 2018-01-26 ENCOUNTER — Ambulatory Visit: Payer: MEDICARE

## 2018-01-29 ENCOUNTER — Ambulatory Visit: Attending: Hematology & Oncology

## 2018-01-29 DIAGNOSIS — R109 Unspecified abdominal pain: Secondary | ICD-10-CM

## 2018-01-29 DIAGNOSIS — R5383 Other fatigue: Secondary | ICD-10-CM

## 2018-01-29 DIAGNOSIS — D61810 Antineoplastic chemotherapy induced pancytopenia: Secondary | ICD-10-CM

## 2018-01-29 DIAGNOSIS — C8330 Diffuse large B-cell lymphoma, unspecified site: Secondary | ICD-10-CM

## 2018-01-29 DIAGNOSIS — D708 Other neutropenia: Secondary | ICD-10-CM

## 2018-01-29 DIAGNOSIS — T451X5A Adverse effect of antineoplastic and immunosuppressive drugs, initial encounter: Secondary | ICD-10-CM

## 2018-01-29 DIAGNOSIS — R61 Generalized hyperhidrosis: Secondary | ICD-10-CM

## 2018-01-29 DIAGNOSIS — K1232 Oral mucositis (ulcerative) due to other drugs: Secondary | ICD-10-CM

## 2018-01-29 LAB — Comprehensive Metabolic Panel
ALKALINE PHOSPHATASE: 64 U/L (ref 37–113)
TOTAL PROTEIN: 6.8 g/dL (ref 6.1–8.2)

## 2018-01-29 LAB — Lactate Dehydrogenase: LACTATE DEHYDROGENASE: 176 U/L (ref 125–256)

## 2018-01-29 NOTE — Patient Instructions
Call with any issues of concern.

## 2018-01-29 NOTE — Progress Notes
Patient name:  Annette Hatfield  MRN:  [id number]  DOB:  April 18, 1941  CSN: [id number]  Date of encounter: 01/29/2018  Provider: Londell Moh. 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 Our plan was to treat with Rituxan and Revlimid. She developed toxicity from Revlimid, even at low doses, so she stopped the medication. She has continued on Rituxan. She has also been receiving alternative care through her naturopath and explains that she has been receiving infusion of Artesunate, and antimalarial drug that has shown some activity in B-cell lymphomas along with high dose vitamin C infusions. Since starting on the Revlimid and Rituxan her subcutaneous skin nodules have clinically resolved and have not recurred. Recent PET scan with no active disease. She does report night sweats and gastric upset the week following rituxan. She also reports that her fatigue and LE neuropathy seem worse over the past 2 weeks. She also report intermittent flank pain. She reports that she would like a 4 week break off all her treatment. She denies CP, SOB, f/c, weight loss.   ???  ECOG performance status: 0    Patient Active Problem List    Diagnosis Date Noted   ??? Flank pain 01/29/2018   ??? Night sweats 01/29/2018   ??? Diarrhea 09/25/2017     revlimid     ??? Skin lesion 06/30/2017   ??? Fatigue 10/28/2016   ??? Neuropathy 10/28/2016   ??? Pancytopenia due to antineoplastic chemotherapy (HCC/RAF) 08/09/2016   ??? Neutropenia associated with mucositis due to antineoplastic therapy (HCC/RAF) 08/09/2016   ??? Large cell (diffuse) non-Hodgkin's lymphoma (HCC/RAF) 08/05/2016   ??? Postmenopausal HRT (hormone replacement therapy) 03/26/2013   ??? Vaginal atrophy 03/26/2013   ??? S/P TAH-BSO 03/26/2013   ??? Cystocele 03/26/2013       Objective:  Current medications  No outpatient prescriptions have been marked as taking for the 01/29/18 encounter (Appointment) with Lucinda Dell., MD.       Allergies:  Allergies Allergen Reactions   ??? Sulfa Antibiotics        Review of Systems:  14 item Normal ROS Constitutional: No fevers, chills, weight loss or appetite changes. + fatigue, + night sweats the week following rituxan.   Eyes: No recent blurry vision, double vision or visual changes.  ENT: No recent sinus pain or congestion. No sore throat or mouth sores. + intermittent mouth sores.   Neck: No neck pain or stiffness.  Cardiovascular: No chest pain or pressure, no palpitations.   Pulmonary: No shortness of breath, no cough or wheezing.   Gastrointestinal: No abdominal pain, nausea, vomiting. No diarrhea.  No melena or BRBPR. + dyspepsia the week following rituxan.   Genitourinary: No urinary frequency, urgency, or dysuria.   Musculoskeletal: No joint pain, muscle pain or back pain. + intermittent flank pain.   Neurologic: No headaches. No numbness, tingling or weakness.   Dermatologic: No rash, pruritus or recent skin changes.   Hematologic: No abnormal bruising or bleeding.   Endocrine: No polyuria, polydipsia, heat or cold intolerance.   Psychiatric: No depressive symptoms, no suicidal or homicidal ideation.       Vitals:  Wt Readings from Last 3 Encounters:   01/29/18 57.6 kg (127 lb)   01/02/18 57.2 kg (126 lb)   01/02/18 57.2 kg (126 lb)     Temp Readings from Last 3 Encounters:   01/29/18 37 ???C (98.6 ???F) (Tympanic)   01/02/18 36.5 ???C (97.7 ???F) (Tympanic)  01/02/18 36.5 ???C (97.7 ???F) (Tympanic)     BP Readings from Last 3 Encounters:   01/29/18 137/83   01/02/18 120/80   01/02/18 120/80     Pulse Readings from Last 3 Encounters:   01/29/18 96   01/02/18 57   01/02/18 57        Physical Exam:  General: Appears well-developed, well-nourished and close to stated age. Weight stable.   Head: Normocephalic, atraumatic.  Eyes: PERRL without icterus.   ENT: Oropharynx is clear, mucus membranes are moist.    CV: Regular in rate and rhythm, no murmurs or gallops. Chest: Clear to auscultation bilaterally without wheezing or rhonchi.  Respiratory effort appears normal.   Abdomen: Soft, nontender and nondistended. Bowel sounds are present and normoactive.   Musculoskeletal: No edema. No cyanosis. Extremities are warm and well-perfused.   Hematologic: No bruising, purpura or petechiae are noted.   Dermatologic: No rashes appreciated. The subcutaneous nodules have clinically resolved.   Lymph nodes: No palpable cervical, SC, axillary adenopathy.   Psychiatric: Affect appropriate.  Pleasant and conversant.     Recent Labs:  Lab Results   Component Value Date    WBC 6.6 01/29/2018    HGB 13.6 01/29/2018    HCT 42.0 01/29/2018    MCV 97.7 (H) 01/29/2018    PLT 234 01/29/2018    NEUTPCT 70.0 01/29/2018    NEUTABS 4.64 01/29/2018    MONOPCT 7.9 01/29/2018    MONOABS 0.52 01/29/2018     Lab Results   Component Value Date    NA 140 01/02/2018    K 4.8 01/02/2018    CL 105 01/02/2018    CO2 21 01/02/2018    CREAT 0.79 01/02/2018    BUN 16 01/02/2018    GLUCOSE 99 01/02/2018    CALCIUM 9.2 01/02/2018    PHOS 3.0 08/29/2017     Lab Results   Component Value Date    TOTPRO 7.0 01/02/2018    ALBUMIN 4.6 01/02/2018    BILITOT 0.5 01/02/2018    AST 42 01/02/2018    ALT 21 01/02/2018    ALKPHOS 60 01/02/2018       Treatment Plan       Pertinent Imaging/Pathology:    Impression and Recommendations:  Diagnoses and all orders for this visit:    Diffuse large B cell lymphoma:  Pt who presented with a 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  She did have a complete response to modified chemotherapy and radiation.  Unfortunately within a year she developed some small subcutaneous nodules that are consistent with recurrent DLBCL. She consequently was started on a somewhat unconventional approach of Revlimid and Rituxan. She did quickly have a significant clinical response with resolution of the subcutaneous nodules. She unfortunately has had a difficult time tolerating Revlimid, even with modified dose, and she stopped the medication. She has continued therapy with Rituxan and has shown an ongoing response per recent PET scan. Today she is expressing that she would like to take a break off therapy for four week as she has been experiencing more fatigue, LE neuropathy, stomatitis and intermittent flank pain. She understands that these issues are not typical side effects of single agent Rituxan. There is some concern however that her issues may relate to alternative therapy she receives with her naturopath (an antimalarial drug, Artesunate, along with high dose vitamin C infusions). She does also understand that we do not have data to support this treatment plan. She would like  a break off all her treatment and plans to return in 4 weeks for Rituxan. We have advised she hold off on starting alternative therapies to ensure she is tolerating the Rituxan.     ???     Return to clinic:  4 weeks        Attending Physician: Londell Moh. Nicholes Rough, MD.  Thereasa Parkin: Stann Mainland. Velda Shell, NP         I have independently reviewed this patient's medications, and current issues, and formulated the plan in conjunction with the nurse practitioner.  The note has been reviewed by me and reflects the nature of our visit and accurately defines our plan.  Pt understands risk of coming off standard therapy.  Becci Batty

## 2018-01-29 NOTE — Nursing Note
Pt is here for treatment- Rituxan.Pt states  She feels ''low energy'', night sweats, left lower extremity neuropathy is worse, gastric upset.  PIV started, labs drawn per orders.Michelee, NP at chair side assessed pt.  Treatment held today per Sharyn Lull, NP.  PIV removed, pressure drsg applied. Pt discharged to self in stable condition.  Pt is re scheduled in August .

## 2018-01-30 LAB — UA,Dipstick: BILIRUBIN: NEGATIVE (ref 5.0–8.0)

## 2018-01-30 LAB — UA,Microscopic: RBCS HPF: 2 {cells}/[HPF] (ref 0–2)

## 2018-01-31 ENCOUNTER — Ambulatory Visit

## 2018-01-31 LAB — Bacterial Culture Urine

## 2018-02-09 ENCOUNTER — Telehealth

## 2018-02-09 NOTE — Telephone Encounter
Spoke to pt, discussed some issues. Will make an appt at her convenience.  She understands her treatment choices ae not-standard and I cannot ensure ongoing success. She is totally clear.

## 2018-02-09 NOTE — Telephone Encounter
Nurses,    Pt called, needs to reschedule treatment appt on 8/13 due to having another MD appt. Please call back to reschedule    Call back # [phone]    Thank you,  Paulita Cradle

## 2018-02-09 NOTE — Telephone Encounter
Dr. Salome Arnt,    Pt has a few questions to ask you regarding treatment and would like to see you prior to next treatment appt on 8/19. Per Marzetta Board, questions are for MD to answer. You do not have availability until 8/29. Please advise.    Call back # [phone]    Thank you,  Paulita Cradle

## 2018-02-12 NOTE — Telephone Encounter
Pt is scheduled for follow up on 8/28 @ 3:30pm

## 2018-02-27 ENCOUNTER — Ambulatory Visit: Attending: Hematology & Oncology

## 2018-03-02 ENCOUNTER — Ambulatory Visit

## 2018-03-05 ENCOUNTER — Ambulatory Visit: Attending: Hematology & Oncology

## 2018-03-05 ENCOUNTER — Ambulatory Visit

## 2018-03-05 DIAGNOSIS — C8330 Diffuse large B-cell lymphoma, unspecified site: Secondary | ICD-10-CM

## 2018-03-05 DIAGNOSIS — D708 Other neutropenia: Secondary | ICD-10-CM

## 2018-03-05 DIAGNOSIS — T451X5A Adverse effect of antineoplastic and immunosuppressive drugs, initial encounter: Secondary | ICD-10-CM

## 2018-03-05 DIAGNOSIS — D61810 Antineoplastic chemotherapy induced pancytopenia: Secondary | ICD-10-CM

## 2018-03-05 DIAGNOSIS — K1232 Oral mucositis (ulcerative) due to other drugs: Secondary | ICD-10-CM

## 2018-03-05 MED ADMIN — RITUXIMAB (RITUXAN) INFUSION 500 ML: 592.5 mg | INTRAVENOUS | @ 19:00:00 | Stop: 2018-03-05

## 2018-03-05 MED ADMIN — ACETAMINOPHEN 325 MG PO TABS: 650 mg | ORAL | @ 19:00:00 | Stop: 2018-03-05

## 2018-03-05 MED ADMIN — DIPHENHYDRAMINE HCL 50 MG/ML IJ SOLN: 50 mg | INTRAVENOUS | @ 19:00:00 | Stop: 2018-03-05

## 2018-03-05 NOTE — Patient Instructions
Call with any issues of concern.

## 2018-03-05 NOTE — Nursing Note
Pt is here today for treatment- Rituxan. Pt reports that she is off all treatment. Pt is feeling well today.  PIV started to right arm, good blood, labs drawn per orders.  Pre meds given per orders.  Rituxan 592 mg given at variable rate , started at 50 mg/hr ,rate escalated 100 mg/hr every 30 min to max rate of 400 mg /hr.; wasted 8 mg.  Pt tolerated treatment well. Pt is scheduled to see Dr. Salome Arnt in next week.    PIV removed pressure drsg applied. Confirmed appt in 4 weeks for next treatment. Pt is discharged in stable condition, ambulatory.

## 2018-03-05 NOTE — Progress Notes
Pt here today for her Rituxan infusion. We did delay her last cycle as she was not feeling well with fatigue, LE neuropathy, stomatitis and intermittent flank pain. She had also been receiving other therapy under the care of an integrative doctor with Artesunate and vitamin C infusions. She has been off all treatment up until this week and is feeling well with no complaints. She is considering having Cecilia testing done by her integrative doctor. I told her that I am not familiar with the test but will do some research. She does plan to discuss further with Dr. Salome Arnt at her follow up visit next week. We will proceed with Rituxan as planned.

## 2018-03-06 LAB — Lactate Dehydrogenase: LACTATE DEHYDROGENASE: 175 U/L (ref 125–256)

## 2018-03-06 LAB — Comprehensive Metabolic Panel
CREATININE: 0.88 mg/dL (ref 0.60–1.30)
SODIUM: 143 mmol/L (ref 135–146)

## 2018-03-09 ENCOUNTER — Ambulatory Visit

## 2018-03-14 ENCOUNTER — Ambulatory Visit: Attending: Hematology & Oncology

## 2018-03-14 NOTE — Progress Notes
Annette Hatfield was seen today for an extended visit to discuss issues regarding her recurrent lymphoma.  She has a very complicated history and has been treated at various centers.  She most recently presented with an unusual situation of subcutaneous lesions that were consistent with the lymphoma.  It was recommended she start on chemotherapy which she refused, and then recommended as a secondary choice to use Revlimid and Rituxan.  She took the Revlimid for a very short period of time which she tolerated poorly so discontinued.  I have encouraged her to stay on the Rituxan monthly which she has done.  She has also taken a multitude of nonconventional therapies, understanding I cannot assess their likely benefit.    She came in today with some concerns.  She did have trouble with the last dose of Rituxan with some diarrhea and aches, that thankfully have improved.  She also had many questions about the status of her disease and wanted to know exactly if or when it will recur and how we can monitor for it.  She has done extensive research looking into circulating tumor cells and other non conventional approaches.    She has noted no new lesions in the subcutaneous area and also did have a recent ENT examination.    PHYSICAL EXAMINATION: Patient looks well.  VSS. Weight is stable.   HEENT: Sclerae anicteric.  LYMPH NODES: No epitrochlear, axillary, cervical, or supraclavicular nodes.   HEART: RRR, normal S1 & S2.  LUNGS: CTAB.  ABDOMEN: Benign with no organomegaly.  EXTREMITIES: No edema.   SKIN: No rash, petechiae, ecchymosis.  Site of prior subcutaneous nodules have no suspicious changes.  NEUROLOGIC: Nonfocal.  ???  ???  ASSESSMENT AND PLAN      Problem 1. Non-Hodgkin's lymphoma:  This is a very complicated case of a patient who has had a very unusual behavior to her lymphoma.  She has done well with prior therapies, and again had a recent recurrence that was treated initially with Revlimid and Rituxan, but mainly now just the Rituxan.  Based on her current evaluation I did tell her I do not see anything suspicious for further recurrence.    I had recommended continuing on therapy with at least the Rituxan on a monthly basis, and again pointed out that I cannot be responsible for her other medications and whether or not they are effective.  We have recommended trying to take the Revlimid at a reduced dose but she feels strongly against it.    She asked many pointed questions about the nature of her lymphoma and wondering why we can't be more specific in terms of when or if it will recur.  She wants to know if there are not better evaluations to monitor her status.  I did tell her I favor getting the PET scan regularly, but she does not want to do it more than every 6 months as she is concerned about radiation.  She will be looking into the issue of the circulating tumor cells, but again pointed out there is no clear data on their clinical relevance.    She also pointed out that time she feels ''lost' in her care.  I did try to address this directly today.  It seems that she is looking for a more significant social support program, beyond the medical issues.  I thus referred her to the [facility] support center which offers a variety of programs, I also offered to have her meet with our social  Financial controller. I did tell her we see her and assess her monthly with her infusion, so I'm not sure if we need to see her more often, but we can if she were to feel more comfortable.    At the end of our meeting she does feel more comfortable with these recommendations, and again I pointed out I favor continue on the Rituxan on a monthly basis with close follow-up with PET scan whenever she will allow.    Londell Moh Nicholes Rough, MD

## 2018-03-15 NOTE — Patient Instructions
As discussed

## 2018-03-27 ENCOUNTER — Ambulatory Visit: Attending: Hematology & Oncology

## 2018-04-02 ENCOUNTER — Ambulatory Visit

## 2018-04-03 ENCOUNTER — Ambulatory Visit: Attending: Hematology & Oncology

## 2018-04-03 DIAGNOSIS — K1232 Oral mucositis (ulcerative) due to other drugs: Secondary | ICD-10-CM

## 2018-04-03 DIAGNOSIS — C8330 Diffuse large B-cell lymphoma, unspecified site: Secondary | ICD-10-CM

## 2018-04-03 DIAGNOSIS — D708 Other neutropenia: Secondary | ICD-10-CM

## 2018-04-03 DIAGNOSIS — D61810 Antineoplastic chemotherapy induced pancytopenia: Secondary | ICD-10-CM

## 2018-04-03 DIAGNOSIS — R5383 Other fatigue: Secondary | ICD-10-CM

## 2018-04-03 DIAGNOSIS — T451X5A Adverse effect of antineoplastic and immunosuppressive drugs, initial encounter: Secondary | ICD-10-CM

## 2018-04-03 MED ADMIN — RITUXIMAB (RITUXAN) INFUSION 500 ML: 592.5 mg | INTRAVENOUS | @ 19:00:00 | Stop: 2018-04-03

## 2018-04-03 MED ADMIN — ACETAMINOPHEN 325 MG PO TABS: 650 mg | ORAL | @ 19:00:00 | Stop: 2018-04-04

## 2018-04-03 MED ADMIN — DIPHENHYDRAMINE HCL 50 MG/ML IJ SOLN: 25 mg | INTRAVENOUS | @ 19:00:00 | Stop: 2018-04-04

## 2018-04-03 NOTE — Patient Instructions
Call with any issues of concern.

## 2018-04-03 NOTE — Progress Notes
Patient name:  Annette Hatfield  MRN:  [id number]  DOB:  April 25, 1941  CSN: [id number]  Date of encounter: 04/03/2018  Provider: Londell Moh. 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 She most recently presented with an unusual recurrence with subcutaneous lesions that were consistent with lymphoma. She did not want to proceed with chemotherapy and it was recommended she start on Revlimid and Rituxan. She took the Revlimid for a short period and developed toxicity, even at low doses, so she stopped the medication. She has continued on monthly Rituxan.and has also been receiving alternative care through her naturopath with infusions of Artesunate and is now taking the oral form. She has continued to struggle with side effects including fatigue and body aches. She felt these issues improved when she took a break off all treatment. She understands that we do not know the side effects of the alternative treatment with the Rituxan and that it is difficult to assess their benefit. She continues to verbalize that she feels depressed and unsupported.   ???  ECOG performance status: 0    Patient Active Problem List    Diagnosis Date Noted   ??? Flank pain 01/29/2018   ??? Night sweats 01/29/2018   ??? Diarrhea 09/25/2017     revlimid     ??? Skin lesion 06/30/2017   ??? Fatigue 10/28/2016   ??? Neuropathy 10/28/2016   ??? Pancytopenia due to antineoplastic chemotherapy (HCC/RAF) 08/09/2016   ??? Neutropenia associated with mucositis due to antineoplastic therapy (HCC/RAF) 08/09/2016   ??? Large cell (diffuse) non-Hodgkin's lymphoma (HCC/RAF) 08/05/2016   ??? Postmenopausal HRT (hormone replacement therapy) 03/26/2013   ??? Vaginal atrophy 03/26/2013   ??? S/P TAH-BSO 03/26/2013   ??? Cystocele 03/26/2013       Objective:  Current medications  No outpatient prescriptions have been marked as taking for the 04/03/18 encounter (Appointment) with Lucinda Dell., MD.       Allergies:  Allergies Allergen Reactions   ??? Sulfa Antibiotics        Review of Systems:  14 item Normal ROS Constitutional: No fevers, chills, weight loss or appetite changes. + fatigue, + night sweats the week following rituxan.   Eyes: No recent blurry vision, double vision or visual changes.  ENT: No recent sinus pain or congestion. No sore throat or mouth sores.   Neck: No neck pain or stiffness.  Cardiovascular: No chest pain or pressure, no palpitations.   Pulmonary: No shortness of breath, no cough or wheezing.   Gastrointestinal: No abdominal pain, nausea, vomiting. + diarrhea.  No melena or BRBPR.   Genitourinary: No urinary frequency, urgency, or dysuria.   Musculoskeletal: No joint pain, muscle pain or back pain.   Neurologic: No headaches. No numbness, tingling or weakness.   Dermatologic: No rash, pruritus or recent skin changes.   Hematologic: No abnormal bruising or bleeding.   Endocrine: No polyuria, polydipsia, heat or cold intolerance.   Psychiatric: No depressive symptoms, no suicidal or homicidal ideation.       Vitals:  Wt Readings from Last 3 Encounters:   04/03/18 59.3 kg (130 lb 12.8 oz)   03/14/18 58.5 kg (129 lb)   03/05/18 58.1 kg (128 lb)     Temp Readings from Last 3 Encounters:   04/03/18 36.9 ???C (98.4 ???F) (Tympanic)   03/14/18 36.7 ???C (98.1 ???F) (Tympanic)   03/05/18 37.2 ???C (99 ???F) (Tympanic)     BP Readings from Last 3  Encounters:   04/03/18 119/83   03/14/18 112/80   03/05/18 127/81     Pulse Readings from Last 3 Encounters:   04/03/18 73   03/14/18 83   03/05/18 93        Physical Exam:  General: Appears well-developed, well-nourished and close to stated age. Weight stable.   Head: Normocephalic, atraumatic.  Eyes: PERRL without icterus.   ENT: Oropharynx is clear, mucus membranes are moist.    Chest: Clear to auscultation bilaterally without wheezing or rhonchi.  Respiratory effort appears normal.   Abdomen: Soft, nontender and nondistended. Bowel sounds are present and normoactive. Musculoskeletal: No edema. No cyanosis. Extremities are warm and well-perfused.   Hematologic: No bruising, purpura or petechiae are noted.   Dermatologic: No rashes appreciated. The subcutaneous nodules have clinically resolved.   Psychiatric: Affect appropriate.  Pleasant and conversant.     Recent Labs:  Lab Results   Component Value Date    WBC 6.7 04/03/2018    HGB 13.6 04/03/2018    HCT 41.2 04/03/2018    MCV 97.6 (H) 04/03/2018    PLT 252 04/03/2018    NEUTPCT 74.9 (H) 04/03/2018    NEUTABS 4.99 04/03/2018    MONOPCT 8.0 04/03/2018    MONOABS 0.53 04/03/2018     Lab Results   Component Value Date    NA 143 03/05/2018    K 4.2 03/05/2018    CL 104 03/05/2018    CO2 26 03/05/2018    CREAT 0.88 03/05/2018    BUN 17 03/05/2018    GLUCOSE 100 (H) 03/05/2018    CALCIUM 9.5 03/05/2018    PHOS 3.0 08/29/2017     Lab Results   Component Value Date    TOTPRO 6.7 03/05/2018    ALBUMIN 4.4 03/05/2018    BILITOT 0.5 03/05/2018    AST 19 03/05/2018    ALT 20 03/05/2018    ALKPHOS 56 03/05/2018       Treatment Plan       Pertinent Imaging/Pathology:    Impression and Recommendations:  Diagnoses and all orders for this visit:    Diffuse large B cell lymphoma:  Pt who presented with a 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  She did have a complete response to modified chemotherapy and radiation.  Unfortunately within a year she developed some small subcutaneous nodules that are consistent with recurrent DLBCL. She consequently was started on a somewhat unconventional approach of Revlimid and Rituxan. She did quickly have a significant clinical response with resolution of the subcutaneous nodules. She unfortunately had a difficult time tolerating Revlimid, even with modified dose, and she stopped the medication. She has continued therapy with Rituxan and has shown an ongoing response per PET scan in June. She has also continued therapy with her naturopath and is receiving oral Artesunate. She continues to report side effects with diarrhea, body aches and fatigue. We again discussed that her side effects are likely r/t treatment, however it is a bit difficult for [place] to know how much is r/t Rituxan as she is also receiving alternative therapy. We have recommended she continue the Rituxan monthly, however she would like to increase time between infusion. We explained that we are okay treating her on a different schedule along with the artesunate, however I again pointed out that we cannot be responsible for her other medications and whether or not they are effective. We have recommended trying to take the Revlimid at a reduced dose but she feels  strongly against it. We will continue with monthly Rituxan, however she does plan to skip her next treatment as she would like to feel well for her trip to [place]. We will continue with to monitor her disease with PET imaging, when she will allow as she is concerned with the radiation.    In terms of her feelings of depression and not feeling well supported, we did discuss this at great length. She is agreeable to meet with our social worker, Tennis Ship, today. She is also willing to go back for therapy with Rosanne Ashing who she has seen in the past. ???     Return to clinic:  After her trip to [place]- 6-8 weeks.         Attending Physician: Londell Moh. Nicholes Rough, MD.  Thereasa Parkin: Stann Mainland. Velda Shell, NP

## 2018-04-03 NOTE — Patient Instructions
Pt will return in November.Marland KitchenMarland Kitchen

## 2018-04-03 NOTE — Nursing Note
Pt returns for her next due infusion of Rituxan, this is monthly infusion #8. Pt presents emotional and stated that she feels very uncomfortable with body aches and symptomatic with GI issues, especially diarrhea. It is noted that Dr Salome Arnt followed up with the pt on 8/28 and addressed all the issues she appears with today.  Time given for Larna Daughters to talk and assess the pt prior to preceding with today's treatment. PIV started, blood return established. Labs drawn and processed. Treatment started with premeds followed by Rituxan titrated by 100 ml every 30 mins to a max rate of 400 ml until completion. Pt also spoke with S Loewenbein LCSW which the pts stated was very helpful and comforting. Pt tolerated infusion well PIV d/c and pt discharged. She will skip October's infusion and she will call the clinic in November, when she returns from her trip, to schedule November's infusion.

## 2018-04-03 NOTE — Nursing Note
Rituxan dose verified with pharm tech.

## 2018-04-04 LAB — Comprehensive Metabolic Panel
ALBUMIN: 4.4 g/dL (ref 3.9–5.0)
CREATININE: 0.77 mg/dL (ref 0.60–1.30)
UREA NITROGEN: 13 mg/dL (ref 7–22)

## 2018-04-04 LAB — Lactate Dehydrogenase

## 2018-05-01 ENCOUNTER — Ambulatory Visit: Payer: MEDICARE | Attending: Hematology & Oncology

## 2018-05-01 ENCOUNTER — Ambulatory Visit: Payer: BLUE CROSS/BLUE SHIELD | Attending: Hematology & Oncology

## 2018-05-30 ENCOUNTER — Telehealth: Payer: MEDICARE

## 2018-05-30 NOTE — Telephone Encounter
Nurses,    Pt called, wanted to schedule her next treatment appointment.  [phone]    Thank you,  Mariane Duval

## 2018-05-31 NOTE — Telephone Encounter
Appt added in CC

## 2018-05-31 NOTE — Telephone Encounter
Spoke with Stanton Kidney. Scheduled Annette Hatfield on 06/25/18 @ 0930 for Rituxan. Date picked by pt.

## 2018-06-22 ENCOUNTER — Ambulatory Visit: Payer: MEDICARE

## 2018-06-22 ENCOUNTER — Telehealth: Payer: BLUE CROSS/BLUE SHIELD

## 2018-06-22 NOTE — Telephone Encounter
Called pt, left message regarding insurance change for 2020.

## 2018-06-25 ENCOUNTER — Telehealth: Payer: BLUE CROSS/BLUE SHIELD

## 2018-06-25 ENCOUNTER — Ambulatory Visit: Payer: BLUE CROSS/BLUE SHIELD | Attending: Hematology & Oncology

## 2018-06-25 DIAGNOSIS — C8330 Diffuse large B-cell lymphoma, unspecified site: Secondary | ICD-10-CM

## 2018-06-25 DIAGNOSIS — K1232 Oral mucositis (ulcerative) due to other drugs: Secondary | ICD-10-CM

## 2018-06-25 DIAGNOSIS — T451X5A Adverse effect of antineoplastic and immunosuppressive drugs, initial encounter: Secondary | ICD-10-CM

## 2018-06-25 DIAGNOSIS — D61810 Antineoplastic chemotherapy induced pancytopenia: Secondary | ICD-10-CM

## 2018-06-25 DIAGNOSIS — D708 Other neutropenia: Secondary | ICD-10-CM

## 2018-06-25 MED ADMIN — RITUXIMAB (RITUXAN) INFUSION 500 ML: 592.5 mg | INTRAVENOUS | @ 19:00:00 | Stop: 2018-06-25

## 2018-06-25 MED ADMIN — DIPHENHYDRAMINE HCL 50 MG/ML IJ SOLN: 25 mg | INTRAVENOUS | @ 18:00:00 | Stop: 2018-06-26

## 2018-06-25 MED ADMIN — ACETAMINOPHEN 325 MG PO TABS: 650 mg | ORAL | @ 18:00:00 | Stop: 2018-06-26

## 2018-06-25 MED ADMIN — RITUXIMAB (RITUXAN) INFUSION 500 ML: 592.5 mg | INTRAVENOUS | @ 21:00:00 | Stop: 2018-06-25

## 2018-06-25 NOTE — Nursing Note
Patient presents to infusion clinic after an almost three month break d/t patients request.  VSS taken on clinic side prior to infusion visit.  Pt seen by Dr. Salome Arnt, see MD notes for assessment details.      PIV inserted to RAC.  Good blood return, flushes well.  Blood drawn per MD order.  See results below.  Okay to proceed with treatment.  Labs are within parameter.    Component      Latest Ref Rng & Units 06/25/2018   White Blood Cell Count      4.0 - 10.0 10/uL 9.5   Red Blood Cell Count      3.9 - 6.1 x10E6/uL 4.3   Hemoglobin      11.2 - 15.7 g/dL 13.8   Hematocrit      34.1 - 44.9 % 42.3     Component      Latest Ref Rng & Units 06/25/2018   Platelet Count, Auto      163 - 369 10/uL 302     Component      Latest Ref Rng & Units 06/25/2018   Absolute Neut Count      1.56 - 6.13 10/uL 7.70 (H)     Administrations This Visit     acetaminophen tab 650 mg     Admin Date  06/25/2018 Action  Given Dose  650 mg Route  Oral Administered By  Guerry Bruin, RN          diphenhydrAMINE 50 mg/mL inj 25 mg     Admin Date  06/25/2018 Action  Given Dose  25 mg Route  IV Push Administered By  Guerry Bruin, RN          riTUXimab 592.5 mg in sodium chloride 0.9% 609.25 mL IVPB     Admin Date  06/25/2018 Action  New Bag/ Syringe/ Cartridge Dose  592 mg Route  Intravenous Administered By  Guerry Bruin, RN              Patient tolerated infusion well with no adverse reactions.  PIV flushed with NS and then dc'd intact.  Pressure dressing applied.  Next appt given for 2/10 per pts request.  Discharged ambulatory to self in stable condition.

## 2018-06-25 NOTE — Progress Notes
Annette Hatfield returns for ongoing management regarding her history of recurrent lymphoma.  She has taken a somewhat unconventional plan of therapy, now on single agent Rituxan and some homeopathic medication she has been receiving.    Since her last visit she has done well.  She has delayed her next infusion and has been feeling well.  Notes that her energy level is better and she has noted no adenopathy or other new symptoms.  Was able to travel to [place] after a trip to [place].      PHYSICAL EXAMINATION: Patient looks well.  VSS. Weight is stable.   HEENT: Sclerae anicteric.  LYMPH NODES: No epitrochlear, axillary, cervical, or supraclavicular nodes.   HEART: RRR, normal S1 & S2.  LUNGS: CTAB.  ABDOMEN: Benign with no organomegaly.  EXTREMITIES: No edema.   SKIN: No rash, petechiae, ecchymosis.  NEUROLOGIC: Nonfocal.    LABS:  pending.    ASSESSMENT AND PLAN      Problem 1. Recurrent lymphoma:  This has been a very interesting and challenging situation with recurrence of an aggressive lymphoma, now basically on single agent Rituxan.  Thankfully she continues to do well with nothing suspicious for progression.  She did have significant toxicity with other prior therapies.  I have recommended a repeat PET scan although she has been hesitant due to radiation, she is willing to go and get it done to make sure there is no other evidence of progressive disease.  Will proceed with the Rituxan today and order a PET scan before next visit.  I have generally recommended the Rituxan monthly, but after much discussion we will switch to every other month due to her quality of life issues.    Annette Buba Salome Arnt, MD

## 2018-06-25 NOTE — Telephone Encounter
Online- sent order to [facility] for PET scan to schedule next month.

## 2018-06-25 NOTE — Progress Notes
Annette Hatfield returns for ongoing management regarding her history of recurrent lymphoma.  He has taken a somewhat unconventional post therapy, now on single agent Rituxan and some homeopathic medication she has been receiving.    Since her last visit she has done well.  She has delayed her next infusion and has been feeling well.  Notes that her energy level is better and she has noted no adenopathy or other new symptoms.  Was able to travel to [place] after a trip to [place].      PHYSICAL EXAMINATION: Patient looks well.  VSS. Weight is stable.   HEENT: Sclerae anicteric.  LYMPH NODES: No epitrochlear, axillary, cervical, or supraclavicular nodes.   HEART: RRR, normal S1 & S2.  LUNGS: CTAB.  ABDOMEN: Benign with no organomegaly.  EXTREMITIES: No edema.   SKIN: No rash, petechiae, ecchymosis.  NEUROLOGIC: Nonfocal.    LABS:  pending.    ASSESSMENT AND PLAN      Problem 1. Recurrent lymphoma:  This has been a very interesting and challenging situation with recurrence of an aggressive lymphoma, now basically on single agent Rituxan.  Thankfully she continues to do well with nothing suspicious for progression.  She did have significant toxicity with other prior therapies.  I have recommended a repeat PET scan although she has been hesitant due to radiation, she is willing to go and get it done to make sure there is no other evidence of progressive disease.  Will proceed with the Rituxan today and order a PET scan before next visit.  I have generally recommended the Rituxan monthly, but after much discussion we will switch to every other month due to her quality of life issues.    [name], MD

## 2018-06-26 LAB — Comprehensive Metabolic Panel
ANION GAP: 17 mmol/L (ref 8–19)
ANION GAP: 17 mmol/L (ref 8–19)

## 2018-06-26 LAB — Lactate Dehydrogenase: LACTATE DEHYDROGENASE: 220 U/L (ref 125–256)

## 2018-06-26 NOTE — Telephone Encounter
Faxed notes and labs to Tania @ [facility].

## 2018-06-27 ENCOUNTER — Ambulatory Visit: Payer: MEDICARE

## 2018-06-28 ENCOUNTER — Ambulatory Visit: Payer: BLUE CROSS/BLUE SHIELD

## 2018-07-30 ENCOUNTER — Ambulatory Visit: Payer: BLUE CROSS/BLUE SHIELD

## 2018-08-02 ENCOUNTER — Telehealth: Payer: BLUE CROSS/BLUE SHIELD

## 2018-08-02 NOTE — Telephone Encounter
Spoke to patient about PET scan.  No evidence of recurrence.  Continue as planned.

## 2018-08-06 ENCOUNTER — Encounter: Admit: 2018-08-06 | Discharge: 2018-08-06 | Payer: MEDICARE

## 2018-08-06 ENCOUNTER — Encounter
Admit: 2018-08-06 | Discharge: 2018-08-10 | Disposition: A | Discharge status: Home / Self Care | Payer: MEDICARE | Source: Other Acute Inpatient Hospital | Attending: Cardiovascular Disease | Admitting: Cardiovascular Disease

## 2018-08-06 DIAGNOSIS — I10 Essential (primary) hypertension: Secondary | ICD-10-CM

## 2018-08-06 DIAGNOSIS — Z950 Presence of cardiac pacemaker: ICD-10-CM

## 2018-08-06 LAB — COMPREHENSIVE METABOLIC PANEL
Lab: 1 mg/dL — ABNORMAL HIGH (ref 0.4–1.00)
Lab: 112 MMOL/L — ABNORMAL HIGH (ref 98–110)
Lab: 143 MMOL/L (ref 137–147)
Lab: 2.1 mg/dL — ABNORMAL HIGH (ref 0.3–1.2)
Lab: 23 MMOL/L (ref 21–30)
Lab: 30 U/L (ref 7–56)
Lab: 30 mg/dL — ABNORMAL HIGH (ref 7–25)
Lab: 49 mL/min — ABNORMAL LOW (ref >60)
Lab: 5.1 MMOL/L (ref 3.5–5.1)
Lab: 59 mL/min — ABNORMAL LOW (ref >60)
Lab: 6.6 g/dL (ref 6.0–8.0)
Lab: 8 10*3/uL (ref 3–12)
Lab: 87 mg/dL (ref 70–100)
Lab: 9.2 mg/dL — ABNORMAL HIGH (ref 8.5–10.6)
Lab: 98 U/L — ABNORMAL LOW (ref 25–110)

## 2018-08-06 LAB — CBC AND DIFF
Lab: 0 10*3/uL (ref 0–0.20)
Lab: 0.1 10*3/uL (ref 0–0.45)
Lab: 6.4 10*3/uL (ref 4.5–11.0)

## 2018-08-06 LAB — MAGNESIUM: Lab: 2 mg/dL (ref 1.6–2.6)

## 2018-08-06 LAB — LIPID PROFILE
Lab: 106 mg/dL (ref <200)
Lab: 48 mg/dL (ref >40)
Lab: 50 mg/dL (ref <100)
Lab: 58 mg/dL
Lab: 82 mg/dL (ref <150)

## 2018-08-06 MED ORDER — LISINOPRIL 10 MG PO TAB
10 mg | Freq: Every day | ORAL | 0 refills | Status: DC
Start: 2018-08-06 — End: 2018-08-06

## 2018-08-06 MED ORDER — HYDRALAZINE 20 MG/ML IJ SOLN
10 mg | INTRAVENOUS | 0 refills | Status: AC | PRN
Start: 2018-08-06 — End: ?
  Administered 2018-08-06: 10 mg via INTRAVENOUS

## 2018-08-06 MED ORDER — ENOXAPARIN 40 MG/0.4 ML SC SYRG
40 mg | Freq: Every day | SUBCUTANEOUS | 0 refills | Status: DC
Start: 2018-08-06 — End: 2018-08-06

## 2018-08-06 MED ORDER — HYDRALAZINE 20 MG/ML IJ SOLN
10 mg | Freq: Once | INTRAVENOUS | 0 refills | Status: CP
Start: 2018-08-06 — End: ?
  Administered 2018-08-06: 22:00:00 10 mg via INTRAVENOUS

## 2018-08-06 MED ORDER — FUROSEMIDE 10 MG/ML IJ SOLN
40 mg | Freq: Once | INTRAVENOUS | 0 refills | Status: CP
Start: 2018-08-06 — End: ?
  Administered 2018-08-06: 40 mg via INTRAVENOUS

## 2018-08-06 MED ORDER — ATORVASTATIN 20 MG PO TAB
10 mg | Freq: Every day | ORAL | 0 refills | Status: DC
Start: 2018-08-06 — End: 2018-08-10
  Administered 2018-08-06 – 2018-08-10 (×5): 10 mg via ORAL

## 2018-08-06 MED ORDER — ASPIRIN 81 MG PO CHEW
81 mg | Freq: Every day | ORAL | 0 refills | Status: DC
Start: 2018-08-06 — End: 2018-08-10
  Administered 2018-08-06 – 2018-08-10 (×5): 81 mg via ORAL

## 2018-08-07 ENCOUNTER — Inpatient Hospital Stay: Admit: 2018-08-07 | Discharge: 2018-08-07 | Payer: MEDICARE

## 2018-08-07 ENCOUNTER — Encounter: Admit: 2018-08-07 | Discharge: 2018-08-07 | Payer: MEDICARE

## 2018-08-07 DIAGNOSIS — I442 Atrioventricular block, complete: Principal | ICD-10-CM

## 2018-08-07 LAB — CBC
Lab: 11 g/dL — ABNORMAL LOW (ref 12.0–15.0)
Lab: 17 % — ABNORMAL HIGH (ref 11–15)
Lab: 204 10*3/uL (ref 150–400)
Lab: 27 pg (ref 26–34)
Lab: 33 g/dL (ref 32.0–36.0)
Lab: 35 % — ABNORMAL LOW (ref 36–45)
Lab: 4.3 M/UL (ref 4.0–5.0)
Lab: 6.3 10*3/uL (ref 4.5–11.0)
Lab: 8.6 FL (ref 7–11)
Lab: 82 FL (ref 80–100)

## 2018-08-07 LAB — BNP (B-TYPE NATRIURETIC PEPTI): Lab: 977 pg/mL — ABNORMAL HIGH (ref 0–100)

## 2018-08-07 LAB — BASIC METABOLIC PANEL: Lab: 142 MMOL/L — ABNORMAL HIGH (ref >60)

## 2018-08-07 LAB — THYROID STIMULATING HORMONE-TSH: Lab: 3.4 uU/mL — ABNORMAL HIGH (ref 0.35–5.00)

## 2018-08-07 LAB — TROPONIN-I: Lab: 0 ng/mL (ref 0.0–0.05)

## 2018-08-07 LAB — MAGNESIUM: Lab: 1.9 mg/dL — ABNORMAL LOW (ref 1.6–2.6)

## 2018-08-07 MED ORDER — MAGNESIUM SULFATE IN D5W 1 GRAM/100 ML IV PGBK
1 g | Freq: Once | INTRAVENOUS | 0 refills | Status: CP
Start: 2018-08-07 — End: ?
  Administered 2018-08-07: 12:00:00 1 g via INTRAVENOUS

## 2018-08-07 MED ORDER — LISINOPRIL 20 MG PO TAB
20 mg | Freq: Every day | ORAL | 0 refills | Status: DC
Start: 2018-08-07 — End: 2018-08-07

## 2018-08-07 MED ORDER — CEFAZOLIN INJ 1GM IVP
2 g | Freq: Once | INTRAVENOUS | 0 refills | Status: CP
Start: 2018-08-07 — End: ?

## 2018-08-07 MED ORDER — FUROSEMIDE 10 MG/ML IJ SOLN
40 mg | Freq: Once | INTRAVENOUS | 0 refills | Status: CP
Start: 2018-08-07 — End: ?
  Administered 2018-08-07: 20:00:00 40 mg via INTRAVENOUS

## 2018-08-07 MED ORDER — HYDROCODONE-ACETAMINOPHEN 5-325 MG PO TAB
1 | ORAL | 0 refills | Status: DC | PRN
Start: 2018-08-07 — End: 2018-08-10
  Administered 2018-08-07: 22:00:00 1 via ORAL

## 2018-08-07 MED ORDER — SODIUM CHLORIDE 0.9 % IV SOLP
INTRAVENOUS | 0 refills | Status: AC
Start: 2018-08-07 — End: ?
  Administered 2018-08-08: 20:00:00 1000.000 mL via INTRAVENOUS

## 2018-08-07 MED ORDER — LIDOCAINE (PF) 10 MG/ML (1 %) IJ SOLN
.1-2 mL | INTRAMUSCULAR | 0 refills | Status: DC | PRN
Start: 2018-08-07 — End: 2018-08-09

## 2018-08-07 MED ORDER — LISINOPRIL 10 MG PO TAB
10 mg | Freq: Every day | ORAL | 0 refills | Status: DC
Start: 2018-08-07 — End: 2018-08-08
  Administered 2018-08-07 – 2018-08-08 (×2): 10 mg via ORAL

## 2018-08-07 MED ORDER — CEFAZOLIN INJ 1GM IVP
1 g | INTRAVENOUS | 0 refills | Status: CP
Start: 2018-08-07 — End: ?
  Administered 2018-08-08: 03:00:00 1 g via INTRAVENOUS

## 2018-08-07 MED ORDER — ACETAMINOPHEN 325 MG PO TAB
325-650 mg | ORAL | 0 refills | Status: DC | PRN
Start: 2018-08-07 — End: 2018-08-10

## 2018-08-08 ENCOUNTER — Encounter: Admit: 2018-08-08 | Discharge: 2018-08-08 | Payer: MEDICARE

## 2018-08-08 ENCOUNTER — Inpatient Hospital Stay: Admit: 2018-08-08 | Discharge: 2018-08-08 | Payer: MEDICARE

## 2018-08-08 DIAGNOSIS — R1310 Dysphagia, unspecified: Secondary | ICD-10-CM

## 2018-08-08 LAB — BASIC METABOLIC PANEL: Lab: 138 MMOL/L — ABNORMAL HIGH (ref >60)

## 2018-08-08 LAB — CBC: Lab: 5.6 K/UL — ABNORMAL LOW (ref 4.5–11.0)

## 2018-08-08 LAB — MAGNESIUM: Lab: 2 mg/dL — ABNORMAL HIGH (ref 1.6–2.6)

## 2018-08-08 MED ORDER — HEPARIN, PORCINE (PF) 5,000 UNIT/0.5 ML IJ SYRG
5000 [IU] | SUBCUTANEOUS | 0 refills | Status: DC
Start: 2018-08-08 — End: 2018-08-09
  Administered 2018-08-09: 03:00:00 5000 [IU] via SUBCUTANEOUS

## 2018-08-08 MED ORDER — PROPOFOL 10 MG/ML IV EMUL 20 ML (INFUSION)(AM)(OR)
INTRAVENOUS | 0 refills | Status: DC
Start: 2018-08-08 — End: 2018-08-08
  Administered 2018-08-08: 20:00:00 50 ug/kg/min via INTRAVENOUS

## 2018-08-08 MED ORDER — FENTANYL CITRATE (PF) 50 MCG/ML IJ SOLN
0 refills | Status: DC
Start: 2018-08-08 — End: 2018-08-08
  Administered 2018-08-08 (×2): 25 ug via INTRAVENOUS

## 2018-08-08 MED ORDER — LISINOPRIL 10 MG PO TAB
10 mg | Freq: Once | ORAL | 0 refills | Status: CP
Start: 2018-08-08 — End: ?
  Administered 2018-08-08: 19:00:00 10 mg via ORAL

## 2018-08-08 MED ORDER — LISINOPRIL 20 MG PO TAB
20 mg | Freq: Every day | ORAL | 0 refills | Status: DC
Start: 2018-08-08 — End: 2018-08-10
  Administered 2018-08-09 – 2018-08-10 (×2): 20 mg via ORAL

## 2018-08-08 MED ORDER — SODIUM CHLORIDE 0.9 % IV SOLP
500 mL | Freq: Once | INTRAVENOUS | 0 refills | Status: AC
Start: 2018-08-08 — End: ?

## 2018-08-08 MED ORDER — FUROSEMIDE 10 MG/ML IJ SOLN
40 mg | Freq: Once | INTRAVENOUS | 0 refills | Status: CP
Start: 2018-08-08 — End: ?
  Administered 2018-08-08: 19:00:00 40 mg via INTRAVENOUS

## 2018-08-08 MED ORDER — PROPOFOL INJ 10 MG/ML IV VIAL
0 refills | Status: DC
Start: 2018-08-08 — End: 2018-08-08
  Administered 2018-08-08 (×2): 10 mg via INTRAVENOUS

## 2018-08-08 MED ORDER — SODIUM CHLORIDE 0.9 % IV SOLP
INTRAVENOUS | 0 refills | Status: CN
Start: 2018-08-08 — End: ?

## 2018-08-09 ENCOUNTER — Encounter: Admit: 2018-08-09 | Discharge: 2018-08-09 | Payer: MEDICARE

## 2018-08-09 DIAGNOSIS — I10 Essential (primary) hypertension: Secondary | ICD-10-CM

## 2018-08-09 DIAGNOSIS — I499 Cardiac arrhythmia, unspecified: Secondary | ICD-10-CM

## 2018-08-09 DIAGNOSIS — I340 Nonrheumatic mitral (valve) insufficiency: Secondary | ICD-10-CM

## 2018-08-09 LAB — MAGNESIUM: Lab: 1.8 mg/dL (ref 1.6–2.6)

## 2018-08-09 LAB — BASIC METABOLIC PANEL: Lab: 140 MMOL/L — ABNORMAL HIGH (ref 137–147)

## 2018-08-09 LAB — CBC: Lab: 7.6 K/UL — ABNORMAL LOW (ref >60)

## 2018-08-09 MED ORDER — PANTOPRAZOLE 20 MG PO TBEC
20 mg | Freq: Two times a day (BID) | ORAL | 0 refills | Status: DC
Start: 2018-08-09 — End: 2018-08-10
  Administered 2018-08-10 (×2): 20 mg via ORAL

## 2018-08-09 MED ORDER — FENTANYL CITRATE (PF) 50 MCG/ML IJ SOLN
50 ug | INTRAVENOUS | 0 refills | Status: DC | PRN
Start: 2018-08-09 — End: 2018-08-09

## 2018-08-09 MED ORDER — LIDOCAINE (PF) 200 MG/10 ML (2 %) IJ SYRG
0 refills | Status: DC
Start: 2018-08-09 — End: 2018-08-09
  Administered 2018-08-09: 20:00:00 60 mg via INTRAVENOUS

## 2018-08-09 MED ORDER — FENTANYL CITRATE (PF) 50 MCG/ML IJ SOLN
25 ug | INTRAVENOUS | 0 refills | Status: DC | PRN
Start: 2018-08-09 — End: 2018-08-09

## 2018-08-09 MED ORDER — PROPOFOL 10 MG/ML IV EMUL 20 ML (INFUSION)(AM)(OR)
INTRAVENOUS | 0 refills | Status: DC
Start: 2018-08-09 — End: 2018-08-09
  Administered 2018-08-09: 20:00:00 100 ug/kg/min via INTRAVENOUS

## 2018-08-09 MED ORDER — LACTATED RINGERS IV SOLP
INTRAVENOUS | 0 refills | Status: DC
Start: 2018-08-09 — End: 2018-08-10
  Administered 2018-08-09: 20:00:00 1000 mL via INTRAVENOUS

## 2018-08-09 MED ORDER — MAGNESIUM SULFATE IN D5W 1 GRAM/100 ML IV PGBK
1 g | Freq: Once | INTRAVENOUS | 0 refills | Status: CP
Start: 2018-08-09 — End: ?
  Administered 2018-08-09: 14:00:00 1 g via INTRAVENOUS

## 2018-08-09 MED ORDER — METOCLOPRAMIDE HCL 5 MG/ML IJ SOLN
10 mg | Freq: Once | INTRAVENOUS | 0 refills | Status: DC | PRN
Start: 2018-08-09 — End: 2018-08-09

## 2018-08-09 MED ORDER — SODIUM CHLORIDE 0.9 % IV SOLP
1000 mL | Freq: Once | INTRAVENOUS | 0 refills | Status: CP
Start: 2018-08-09 — End: ?
  Administered 2018-08-09: 20:00:00 1000 mL via INTRAVENOUS

## 2018-08-09 MED ORDER — ENOXAPARIN 40 MG/0.4 ML SC SYRG
40 mg | Freq: Every day | SUBCUTANEOUS | 0 refills | Status: DC
Start: 2018-08-09 — End: 2018-08-10
  Administered 2018-08-10: 03:00:00 40 mg via SUBCUTANEOUS

## 2018-08-09 MED ORDER — DIPHENHYDRAMINE HCL 50 MG/ML IJ SOLN
25 mg | Freq: Once | INTRAVENOUS | 0 refills | Status: DC | PRN
Start: 2018-08-09 — End: 2018-08-09

## 2018-08-09 MED ORDER — PROPOFOL INJ 10 MG/ML IV VIAL
0 refills | Status: DC
Start: 2018-08-09 — End: 2018-08-09
  Administered 2018-08-09: 20:00:00 20 mg via INTRAVENOUS
  Administered 2018-08-09: 20:00:00 10 mg via INTRAVENOUS
  Administered 2018-08-09: 20:00:00 30 mg via INTRAVENOUS

## 2018-08-09 MED ORDER — PROMETHAZINE 25 MG/ML IJ SOLN
6.25 mg | INTRAVENOUS | 0 refills | Status: DC | PRN
Start: 2018-08-09 — End: 2018-08-09

## 2018-08-09 MED ORDER — FUROSEMIDE 40 MG PO TAB
40 mg | Freq: Once | ORAL | 0 refills | Status: CP
Start: 2018-08-09 — End: ?
  Administered 2018-08-09: 17:00:00 40 mg via ORAL

## 2018-08-10 ENCOUNTER — Encounter: Admit: 2018-08-10 | Discharge: 2018-08-10 | Payer: MEDICARE

## 2018-08-10 ENCOUNTER — Inpatient Hospital Stay: Admit: 2018-08-10 | Discharge: 2018-08-10 | Payer: MEDICARE

## 2018-08-10 ENCOUNTER — Inpatient Hospital Stay: Admit: 2018-08-09 | Discharge: 2018-08-09 | Payer: MEDICARE

## 2018-08-10 ENCOUNTER — Inpatient Hospital Stay: Admit: 2018-08-08 | Discharge: 2018-08-08 | Payer: MEDICARE

## 2018-08-10 ENCOUNTER — Inpatient Hospital Stay: Admit: 2018-08-07 | Discharge: 2018-08-07 | Payer: MEDICARE

## 2018-08-10 DIAGNOSIS — I499 Cardiac arrhythmia, unspecified: Secondary | ICD-10-CM

## 2018-08-10 DIAGNOSIS — Z8673 Personal history of transient ischemic attack (TIA), and cerebral infarction without residual deficits: Secondary | ICD-10-CM

## 2018-08-10 DIAGNOSIS — J90 Pleural effusion, not elsewhere classified: Secondary | ICD-10-CM

## 2018-08-10 DIAGNOSIS — K222 Esophageal obstruction: Secondary | ICD-10-CM

## 2018-08-10 DIAGNOSIS — N179 Acute kidney failure, unspecified: Secondary | ICD-10-CM

## 2018-08-10 DIAGNOSIS — K449 Diaphragmatic hernia without obstruction or gangrene: Secondary | ICD-10-CM

## 2018-08-10 DIAGNOSIS — E785 Hyperlipidemia, unspecified: Secondary | ICD-10-CM

## 2018-08-10 DIAGNOSIS — I442 Atrioventricular block, complete: Secondary | ICD-10-CM

## 2018-08-10 DIAGNOSIS — I4510 Unspecified right bundle-branch block: Secondary | ICD-10-CM

## 2018-08-10 DIAGNOSIS — Z87891 Personal history of nicotine dependence: Secondary | ICD-10-CM

## 2018-08-10 DIAGNOSIS — I340 Nonrheumatic mitral (valve) insufficiency: Secondary | ICD-10-CM

## 2018-08-10 DIAGNOSIS — I10 Essential (primary) hypertension: Secondary | ICD-10-CM

## 2018-08-10 DIAGNOSIS — I351 Nonrheumatic aortic (valve) insufficiency: Secondary | ICD-10-CM

## 2018-08-10 DIAGNOSIS — R001 Bradycardia, unspecified: Secondary | ICD-10-CM

## 2018-08-10 DIAGNOSIS — K219 Gastro-esophageal reflux disease without esophagitis: Secondary | ICD-10-CM

## 2018-08-10 DIAGNOSIS — I348 Other nonrheumatic mitral valve disorders: Secondary | ICD-10-CM

## 2018-08-10 DIAGNOSIS — K225 Diverticulum of esophagus, acquired: Secondary | ICD-10-CM

## 2018-08-10 DIAGNOSIS — R0902 Hypoxemia: Secondary | ICD-10-CM

## 2018-08-10 DIAGNOSIS — R1310 Dysphagia, unspecified: Secondary | ICD-10-CM

## 2018-08-10 LAB — BASIC METABOLIC PANEL
Lab: 140 MMOL/L (ref 137–147)
Lab: 4 MMOL/L (ref 3.5–5.1)

## 2018-08-10 LAB — CBC: Lab: 6.8 K/UL — ABNORMAL HIGH (ref >60)

## 2018-08-10 LAB — MAGNESIUM: Lab: 1.9 mg/dL — ABNORMAL LOW (ref 1.6–2.6)

## 2018-08-10 MED ORDER — BARIUM SULFATE 40 % (W/V) PO POWD
130 mL | Freq: Once | ORAL | 0 refills | Status: CP
Start: 2018-08-10 — End: ?
  Administered 2018-08-10: 16:00:00 130 mL via ORAL

## 2018-08-10 MED ORDER — BARIUM SULFATE 40 % (W/V) PO SUSP
40 mL | Freq: Once | ORAL | 0 refills | Status: CP
Start: 2018-08-10 — End: ?
  Administered 2018-08-10: 16:00:00 40 mL via ORAL

## 2018-08-10 MED ORDER — ACETAMINOPHEN 325 MG PO TAB
325-650 mg | ORAL_TABLET | ORAL | 3 refills | Status: AC | PRN
Start: 2018-08-10 — End: ?

## 2018-08-10 MED ORDER — BARIUM SULFATE 98 % PO SUSR
70 mL | Freq: Once | ORAL | 0 refills | Status: CP
Start: 2018-08-10 — End: ?
  Administered 2018-08-10: 16:00:00 70 mL via ORAL

## 2018-08-10 MED ORDER — BARIUM SULFATE 40 % (W/V), 30% (W/W) PO PSTE
10 mL | Freq: Once | ORAL | 0 refills | Status: CP
Start: 2018-08-10 — End: ?
  Administered 2018-08-10: 16:00:00 10 mL via ORAL

## 2018-08-10 MED ORDER — SOD BICARB-CITRIC AC-SIMETH 2.21-1.53 GRAM/4 GRAM PO GREP
1 | Freq: Once | ORAL | 0 refills | Status: CP
Start: 2018-08-10 — End: ?

## 2018-08-10 MED ORDER — SODIUM CHLORIDE 0.9 % IV SOLP
INTRAVENOUS | 0 refills | Status: CN
Start: 2018-08-10 — End: ?

## 2018-08-10 MED ORDER — LISINOPRIL 20 MG PO TAB
20 mg | ORAL_TABLET | Freq: Every day | ORAL | 3 refills | Status: AC
Start: 2018-08-10 — End: ?

## 2018-08-10 MED ORDER — MAGNESIUM SULFATE IN D5W 1 GRAM/100 ML IV PGBK
1 g | Freq: Once | INTRAVENOUS | 0 refills | Status: CP
Start: 2018-08-10 — End: ?
  Administered 2018-08-10: 12:00:00 1 g via INTRAVENOUS

## 2018-08-10 MED ORDER — BARIUM SULFATE 700 MG PO TAB
700 mg | Freq: Once | ORAL | 0 refills | Status: CP
Start: 2018-08-10 — End: ?
  Administered 2018-08-10: 16:00:00 700 mg via ORAL

## 2018-08-10 MED ORDER — PANTOPRAZOLE 20 MG PO TBEC
20 mg | ORAL_TABLET | Freq: Two times a day (BID) | ORAL | 1 refills | 90.00000 days | Status: AC
Start: 2018-08-10 — End: ?

## 2018-08-13 ENCOUNTER — Encounter: Admit: 2018-08-13 | Discharge: 2018-08-13 | Payer: MEDICARE

## 2018-08-16 ENCOUNTER — Ambulatory Visit: Admit: 2018-08-16 | Discharge: 2018-08-16 | Payer: MEDICARE

## 2018-08-16 ENCOUNTER — Encounter: Admit: 2018-08-16 | Discharge: 2018-08-16 | Payer: MEDICARE

## 2018-08-16 DIAGNOSIS — Z950 Presence of cardiac pacemaker: Secondary | ICD-10-CM

## 2018-08-24 ENCOUNTER — Ambulatory Visit: Payer: MEDICARE

## 2018-08-24 ENCOUNTER — Ambulatory Visit: Payer: BLUE CROSS/BLUE SHIELD

## 2018-08-24 ENCOUNTER — Encounter: Admit: 2018-08-24 | Discharge: 2018-08-24 | Payer: MEDICARE

## 2018-08-27 ENCOUNTER — Ambulatory Visit: Payer: MEDICARE | Attending: Hematology & Oncology

## 2018-08-27 ENCOUNTER — Ambulatory Visit: Payer: BLUE CROSS/BLUE SHIELD | Attending: Hematology & Oncology

## 2018-08-27 DIAGNOSIS — C8330 Diffuse large B-cell lymphoma, unspecified site: Secondary | ICD-10-CM

## 2018-08-27 DIAGNOSIS — T451X5A Adverse effect of antineoplastic and immunosuppressive drugs, initial encounter: Secondary | ICD-10-CM

## 2018-08-27 DIAGNOSIS — D708 Other neutropenia: Secondary | ICD-10-CM

## 2018-08-27 DIAGNOSIS — K1232 Oral mucositis (ulcerative) due to other drugs: Secondary | ICD-10-CM

## 2018-08-27 DIAGNOSIS — D61810 Antineoplastic chemotherapy induced pancytopenia: Secondary | ICD-10-CM

## 2018-08-27 LAB — Comprehensive Metabolic Panel
SODIUM: 138 mmol/L (ref 135–146)
SODIUM: 138 mmol/L (ref 135–146)

## 2018-08-27 LAB — Lactate Dehydrogenase: LACTATE DEHYDROGENASE: 335 U/L — ABNORMAL HIGH (ref 125–256)

## 2018-08-27 MED ADMIN — RITUXIMAB (RITUXAN) INFUSION 500 ML: 592.5 mg | INTRAVENOUS | @ 18:00:00 | Stop: 2018-08-27

## 2018-08-27 MED ADMIN — ACETAMINOPHEN 325 MG PO TABS: 650 mg | ORAL | @ 18:00:00 | Stop: 2018-08-28

## 2018-08-27 MED ADMIN — DIPHENHYDRAMINE HCL 50 MG/ML IJ SOLN: 25 mg | INTRAVENOUS | @ 18:00:00 | Stop: 2018-08-28

## 2018-08-27 NOTE — Nursing Note
Pt returns for a maintenance dose of Rituxan. PIV started, labs drawn and processed. Treatment started with premeds followed by Rituxan titrated by 100 ml every 30 mins to a max rate of 400 ml to completion. Pt tolerated the whole infusion well. After the infusion PIV d/c and pt discharged with a return appointment in 8 weeks.   Patient presents for maintenance infusion on Rituxan protocol.  Medication Rituxan administered without incident at a dose of 592 mg with 7 mg wasted.

## 2018-08-27 NOTE — Progress Notes
Annette Hatfield returns today for ongoing management regarding her history of lymphoma.  She has been through multiple therapies in the past, currently receiving Rituxan every 2 months.  She does understand this is not standard of care, but is comfortable with her decisions.  Does remain on a multitude of homeopathic medications as well.    She tells me she continues to feel very well.  She has noted no new nodules.  Energy level has been good.  No fevers or chills.    PHYSICAL EXAMINATION: Patient looks well.  VSS. Weight is stable.   HEENT: Sclerae anicteric.  LYMPH NODES: No epitrochlear, axillary, cervical, or supraclavicular nodes.   HEART: RRR, normal S1 & S2.  LUNGS: CTAB.  ABDOMEN: Benign with no organomegaly.  EXTREMITIES: No edema.   SKIN: No rash, petechiae, ecchymosis.  NEUROLOGIC: Nonfocal.    LABS:  CBC within normal limits.    Recent PET scan showed no evidence of activity.    ASSESSMENT AND PLAN      Problem 1. Lymphoma:  She has had recurrent disease despite aggressive prior therapies.  For now will continue on the single agent Rituxan with her other medications that she has been receiving through homeopathic physician.  At this point doing well with no evidence recurrence.    We did discuss the overall plan.  I did tell her that this is an unconventional approach but there is a high risk of recurrence.  Does seem reasonable to continue the Rituxan as she is doing well with no obvious toxicities.  She does express understanding.    [name], MD

## 2018-08-28 ENCOUNTER — Encounter: Admit: 2018-08-28 | Discharge: 2018-08-28 | Payer: MEDICARE

## 2018-08-28 ENCOUNTER — Ambulatory Visit: Admit: 2018-08-28 | Discharge: 2018-08-28 | Payer: MEDICARE

## 2018-08-28 DIAGNOSIS — R1310 Dysphagia, unspecified: Principal | ICD-10-CM

## 2018-08-28 DIAGNOSIS — K449 Diaphragmatic hernia without obstruction or gangrene: Secondary | ICD-10-CM

## 2018-08-28 DIAGNOSIS — I499 Cardiac arrhythmia, unspecified: ICD-10-CM

## 2018-08-28 DIAGNOSIS — I10 Essential (primary) hypertension: Principal | ICD-10-CM

## 2018-08-28 DIAGNOSIS — K222 Esophageal obstruction: ICD-10-CM

## 2018-08-28 DIAGNOSIS — I340 Nonrheumatic mitral (valve) insufficiency: ICD-10-CM

## 2018-08-28 DIAGNOSIS — Z950 Presence of cardiac pacemaker: Principal | ICD-10-CM

## 2018-08-28 DIAGNOSIS — Q396 Congenital diverticulum of esophagus: ICD-10-CM

## 2018-08-28 DIAGNOSIS — Z87891 Personal history of nicotine dependence: ICD-10-CM

## 2018-08-28 MED ORDER — HALOPERIDOL LACTATE 5 MG/ML IJ SOLN
1 mg | Freq: Once | INTRAVENOUS | 0 refills | Status: CN | PRN
Start: 2018-08-28 — End: ?

## 2018-08-28 MED ORDER — PROPOFOL INJ 10 MG/ML IV VIAL
0 refills | Status: DC
Start: 2018-08-28 — End: 2018-08-28
  Administered 2018-08-28: 19:00:00 20 mg via INTRAVENOUS
  Administered 2018-08-28: 19:00:00 40 mg via INTRAVENOUS

## 2018-08-28 MED ORDER — LACTATED RINGERS IV SOLP
1000 mL | Freq: Once | INTRAVENOUS | 0 refills | Status: CP
Start: 2018-08-28 — End: ?
  Administered 2018-08-28: 19:00:00 1000 mL via INTRAVENOUS

## 2018-08-28 MED ORDER — DIPHENHYDRAMINE HCL 50 MG/ML IJ SOLN
25 mg | Freq: Once | INTRAVENOUS | 0 refills | Status: CN | PRN
Start: 2018-08-28 — End: ?

## 2018-08-28 MED ORDER — LACTATED RINGERS IV SOLP
0 refills | Status: DC
Start: 2018-08-28 — End: 2018-08-28
  Administered 2018-08-28: 19:00:00 via INTRAVENOUS

## 2018-08-28 MED ORDER — SODIUM CHLORIDE 0.9 % IV SOLP
INTRAVENOUS | 0 refills | Status: CN
Start: 2018-08-28 — End: ?

## 2018-08-28 MED ORDER — PROPOFOL 10 MG/ML IV EMUL 20 ML (INFUSION)(AM)(OR)
INTRAVENOUS | 0 refills | Status: DC
Start: 2018-08-28 — End: 2018-08-28
  Administered 2018-08-28: 19:00:00 110 ug/kg/min via INTRAVENOUS

## 2018-08-28 MED ORDER — PROMETHAZINE 25 MG/ML IJ SOLN
6.25 mg | INTRAVENOUS | 0 refills | Status: CN | PRN
Start: 2018-08-28 — End: ?

## 2018-08-28 MED ORDER — LIDOCAINE (PF) 200 MG/10 ML (2 %) IJ SYRG
0 refills | Status: DC
Start: 2018-08-28 — End: 2018-08-28
  Administered 2018-08-28: 19:00:00 100 mg via INTRAVENOUS

## 2018-08-29 ENCOUNTER — Ambulatory Visit: Admit: 2018-08-29 | Discharge: 2018-08-29 | Payer: MEDICARE

## 2018-08-29 ENCOUNTER — Encounter: Admit: 2018-08-29 | Discharge: 2018-08-29 | Payer: MEDICARE

## 2018-08-29 DIAGNOSIS — I340 Nonrheumatic mitral (valve) insufficiency: ICD-10-CM

## 2018-08-29 DIAGNOSIS — I10 Essential (primary) hypertension: Principal | ICD-10-CM

## 2018-08-29 DIAGNOSIS — I499 Cardiac arrhythmia, unspecified: ICD-10-CM

## 2018-08-29 DIAGNOSIS — R1310 Dysphagia, unspecified: Principal | ICD-10-CM

## 2018-08-30 ENCOUNTER — Encounter: Admit: 2018-08-30 | Discharge: 2018-08-30 | Payer: MEDICARE

## 2018-08-30 DIAGNOSIS — I442 Atrioventricular block, complete: ICD-10-CM

## 2018-08-30 DIAGNOSIS — Z950 Presence of cardiac pacemaker: Principal | ICD-10-CM

## 2018-09-03 ENCOUNTER — Encounter: Admit: 2018-09-03 | Discharge: 2018-09-03 | Payer: MEDICARE

## 2018-09-03 ENCOUNTER — Ambulatory Visit: Admit: 2018-09-03 | Discharge: 2018-09-04 | Payer: MEDICARE

## 2018-09-03 ENCOUNTER — Emergency Department: Admit: 2018-09-03 | Discharge: 2018-09-03 | Payer: MEDICARE | Attending: Emergency Medical Services

## 2018-09-03 ENCOUNTER — Emergency Department
Admit: 2018-09-03 | Discharge: 2018-09-04 | Disposition: A | Discharge status: Home / Self Care | Payer: MEDICARE | Attending: Emergency Medical Services

## 2018-09-03 DIAGNOSIS — I499 Cardiac arrhythmia, unspecified: ICD-10-CM

## 2018-09-03 DIAGNOSIS — I340 Nonrheumatic mitral (valve) insufficiency: ICD-10-CM

## 2018-09-03 DIAGNOSIS — I10 Essential (primary) hypertension: Principal | ICD-10-CM

## 2018-09-03 DIAGNOSIS — Z950 Presence of cardiac pacemaker: ICD-10-CM

## 2018-09-03 MED ORDER — SODIUM CHLORIDE 0.9 % IJ SOLN
50 mL | Freq: Once | INTRAVENOUS | 0 refills | Status: DC
Start: 2018-09-03 — End: 2018-09-04

## 2018-09-03 MED ORDER — IOHEXOL 350 MG IODINE/ML IV SOLN
65 mL | Freq: Once | INTRAVENOUS | 0 refills | Status: CP
Start: 2018-09-03 — End: ?
  Administered 2018-09-04: 04:00:00 65 mL via INTRAVENOUS

## 2018-09-03 NOTE — Progress Notes
Date of Service: 09/03/2018    Joan Potter is a 78 y.o. female.       HPI     I saw [name] today for the first time after she had a dual-chamber pacemaker placed on January 14th of this year by [facility] for complete heart block.     She also had severe esophageal disease and a hiatal hernia that precluded the performance of transesophageal echocardiography.  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  She returned for another GI procedure on her esophagus only 6 days ago and was told at that time that there was some balloon dilatation and other manipulation.     She did okay until the last few days when she started to have difficulty swallowing and started to develop severe pleuritic chest pain.     I am not sure that this is a pacemaker perforation.  The x-rays from [facility] this morning appear to show proper placement of the biventricular pacer.  There is obviously a large gastric hernia, only trivial pleural fluid, pacemaker in the left upper chest.     I am more concerned by a complication from her GI procedure than from her pacemaker.  Nevertheless, because of limitations of our office here at [facility], we are going to be sending her to [facility] for complete evaluation in light of her recent GI esophageal ballooning back and her recent pacemaker insertion.     A remote pacemaker evaluation was otherwise normal, other than probably bigeminal PACs with aberrancy and ventricular paced rhythm and atrial sensing.     I discussed the case with Dr. Bradly Bienenstock remotely by phone and I think we both agree that further workup was necessary in light of her symptom complex.     She denies fevers, chills or sweats, is not throwing up food, but is able to get food down if she liquifies it.    (URK:[id number])             Vitals:    09/03/18 1345 09/03/18 1350   BP: 162/82 166/80   BP Source: Arm, Left Upper Arm, Right Upper   SpO2: 95%    Weight: 50.8 kg (112 lb)    Height: 1.499 m (4' 11)

## 2018-09-04 ENCOUNTER — Ambulatory Visit: Admit: 2018-09-04 | Discharge: 2018-09-05 | Payer: MEDICARE

## 2018-09-04 ENCOUNTER — Encounter: Admit: 2018-09-04 | Discharge: 2018-09-04 | Payer: MEDICARE

## 2018-09-04 DIAGNOSIS — R0602 Shortness of breath: Principal | ICD-10-CM

## 2018-09-04 DIAGNOSIS — Z87891 Personal history of nicotine dependence: ICD-10-CM

## 2018-09-04 LAB — D-DIMER: Lab: 447 ng{FEU}/mL — ABNORMAL HIGH (ref <500)

## 2018-09-04 LAB — COMPREHENSIVE METABOLIC PANEL
Lab: 0.8 mg/dL (ref 0.4–1.00)
Lab: 10 10*3/uL — ABNORMAL HIGH (ref 3–12)
Lab: 10 mg/dL (ref 8.5–10.6)
Lab: 104 MMOL/L (ref 98–110)
Lab: 108 U/L (ref 25–110)
Lab: 11 U/L — ABNORMAL LOW (ref 7–56)
Lab: 139 MMOL/L (ref 137–147)
Lab: 14 mg/dL (ref 7–25)
Lab: 2.1 mg/dL — ABNORMAL HIGH (ref 0.3–1.2)
Lab: 21 U/L (ref 7–40)
Lab: 25 MMOL/L — ABNORMAL HIGH (ref 21–30)
Lab: 4.1 MMOL/L (ref 3.5–5.1)
Lab: 4.2 g/dL (ref 3.5–5.0)
Lab: 7.7 g/dL — ABNORMAL HIGH (ref 6.0–8.0)
Lab: 98 mg/dL — ABNORMAL HIGH (ref 70–100)
Lab: >60 mL/min (ref >60)
Lab: >60 mL/min (ref >60)

## 2018-09-04 LAB — POC TROPONIN
Lab: 0 ng/mL — ABNORMAL HIGH (ref >60)
Lab: 0 ng/mL (ref 0.00–0.05)

## 2018-09-04 LAB — BNP POC ER: Lab: 69 pg/mL (ref 0–100)

## 2018-09-04 LAB — PTT (APTT): Lab: 28 s (ref 24.0–36.5)

## 2018-09-04 LAB — CBC AND DIFF: Lab: 9.3 10*3/uL (ref 4.5–11.0)

## 2018-09-04 LAB — PROTIME INR (PT): Lab: 1 g/dL (ref 0.8–1.2)

## 2018-09-04 NOTE — ED Notes
PT Belongings    [employer]  [employer]  [employer]  Credit Cards  Driver's License  $45 cash

## 2018-09-07 ENCOUNTER — Encounter: Admit: 2018-09-07 | Discharge: 2018-09-07 | Payer: MEDICARE

## 2018-10-13 ENCOUNTER — Ambulatory Visit: Payer: MEDICARE

## 2018-10-23 ENCOUNTER — Ambulatory Visit: Payer: BLUE CROSS/BLUE SHIELD

## 2018-10-23 ENCOUNTER — Ambulatory Visit: Payer: MEDICARE

## 2018-11-15 ENCOUNTER — Ambulatory Visit: Admit: 2018-11-15 | Discharge: 2018-11-16 | Payer: MEDICARE

## 2018-11-15 ENCOUNTER — Encounter: Admit: 2018-11-15 | Discharge: 2018-11-15 | Payer: MEDICARE

## 2018-11-16 ENCOUNTER — Ambulatory Visit: Payer: BLUE CROSS/BLUE SHIELD

## 2018-11-16 DIAGNOSIS — I442 Atrioventricular block, complete: Principal | ICD-10-CM

## 2018-11-16 DIAGNOSIS — Z950 Presence of cardiac pacemaker: ICD-10-CM

## 2018-11-19 ENCOUNTER — Ambulatory Visit: Payer: MEDICARE

## 2018-11-19 DIAGNOSIS — T451X5A Adverse effect of antineoplastic and immunosuppressive drugs, initial encounter: Secondary | ICD-10-CM

## 2018-11-19 DIAGNOSIS — C8330 Diffuse large B-cell lymphoma, unspecified site: Secondary | ICD-10-CM

## 2018-11-19 DIAGNOSIS — D708 Other neutropenia: Secondary | ICD-10-CM

## 2018-11-19 DIAGNOSIS — K1232 Oral mucositis (ulcerative) due to other drugs: Secondary | ICD-10-CM

## 2018-11-19 DIAGNOSIS — D61810 Antineoplastic chemotherapy induced pancytopenia: Secondary | ICD-10-CM

## 2018-11-19 MED ADMIN — DIPHENHYDRAMINE HCL 50 MG/ML IJ SOLN: 25 mg | INTRAVENOUS | @ 19:00:00 | Stop: 2018-11-20

## 2018-11-19 MED ADMIN — ACETAMINOPHEN 325 MG PO TABS: 650 mg | ORAL | @ 19:00:00 | Stop: 2018-11-20

## 2018-11-19 NOTE — Patient Instructions
Continue current medications.  Call with any problems.

## 2018-11-19 NOTE — Progress Notes
Annette Hatfield returns for ongoing management of her recurrent non-Hodgkin's lymphoma.  She has been receiving Rituxan every 2 months, understanding it is not standard of care.  She also continues on multiple alternative therapies including vitamin infusions.    Since her last visit she has continued to do well.  Has not noticed any new subcutaneous lesions.  No fevers or chills, night sweats, or weight loss.    She has been avidly adhering to social distancing.    Limited PHYSICAL EXAMINATION: Patient looks well.  VSS. Weight is stable.   HEENT: Sclerae anicteric.  EXTREMITIES: No edema.   SKIN: No rash, petechiae, ecchymosis.  NEUROLOGIC: Nonfocal.    LABS:  pending.  Recent labs from another physician do include a normal IgG level with no monoclonal protein noted.    ASSESSMENT AND PLAN      Problem 1. Recurrent lymphoma:  She is currently receiving only single agent Rituxan which she is tolerating well.  There is no evidence of recurrence clinically.  We did do a PET scan in January.  We discussed repeating imaging but she wishes to hold off for now.  I told her it is again our best test to make sure there is no signs of early recurrence, and she does seem clear in that regard.  We compromised, and will consider another scan in August as long as she is feeling well.  Will continue the Rituxan every 2 months continue to monitor for any new issues.    Annette Buba Salome Arnt, MD

## 2018-11-19 NOTE — Patient Instructions
Pt to return next week.

## 2018-11-19 NOTE — Nursing Note
Attempted to start the pt's IV, after three attempts the pt requested to have the treatment postponed for a week. Dr Salome Arnt was ok with this request. Pt discharged with an appointment to return next week.

## 2018-11-27 ENCOUNTER — Ambulatory Visit: Payer: BLUE CROSS/BLUE SHIELD | Attending: Hematology & Oncology

## 2018-11-27 ENCOUNTER — Ambulatory Visit: Payer: MEDICARE | Attending: Hematology & Oncology

## 2018-11-27 DIAGNOSIS — K1232 Oral mucositis (ulcerative) due to other drugs: Secondary | ICD-10-CM

## 2018-11-27 DIAGNOSIS — C8330 Diffuse large B-cell lymphoma, unspecified site: Secondary | ICD-10-CM

## 2018-11-27 DIAGNOSIS — D61810 Antineoplastic chemotherapy induced pancytopenia: Secondary | ICD-10-CM

## 2018-11-27 DIAGNOSIS — T451X5A Adverse effect of antineoplastic and immunosuppressive drugs, initial encounter: Secondary | ICD-10-CM

## 2018-11-27 DIAGNOSIS — D708 Other neutropenia: Secondary | ICD-10-CM

## 2018-11-27 MED ADMIN — ACETAMINOPHEN 325 MG PO TABS: 650 mg | ORAL | @ 19:00:00 | Stop: 2018-11-28

## 2018-11-27 MED ADMIN — RITUXIMAB (RITUXAN) INFUSION 500 ML: 592.5 mg | INTRAVENOUS | @ 19:00:00 | Stop: 2018-11-27

## 2018-11-27 MED ADMIN — DIPHENHYDRAMINE HCL 50 MG/ML IJ SOLN: 25 mg | INTRAVENOUS | @ 19:00:00 | Stop: 2018-11-28

## 2018-11-27 NOTE — Nursing Note
Pt is here for maintenance treatment- Rituxan every 8 weeks. Pt feels well today. Pt is anxious re starting an IV access. PIV started to left arm, established good blood return, labs drawn as ordered. Premeds given. Rituxan 592 mg given at variable rate started at 100 mg/hr to max rate of 400 mg/hr. Confirmed appointment in 8 weeks. Pt tolerated. PIV removed pressure dressing applied.Pt discharged to self in stable condition,ambulatory.

## 2018-11-28 LAB — Lactate Dehydrogenase: LACTATE DEHYDROGENASE: 748 U/L — ABNORMAL HIGH (ref 125–256)

## 2018-11-28 LAB — Comprehensive Metabolic Panel
ALBUMIN: 4.5 g/dL (ref 3.9–5.0)
GFR ESTIMATE FOR AFRICAN AMERICAN: >89 mL/min/{1.73_m2} (ref 37–113)
SODIUM: 139 mmol/L (ref 135–146)

## 2018-11-30 ENCOUNTER — Ambulatory Visit: Payer: BLUE CROSS/BLUE SHIELD

## 2018-12-06 ENCOUNTER — Encounter: Admit: 2018-12-06 | Discharge: 2018-12-06 | Payer: MEDICARE

## 2018-12-06 ENCOUNTER — Ambulatory Visit: Admit: 2018-12-06 | Discharge: 2018-12-07 | Payer: MEDICARE

## 2018-12-06 DIAGNOSIS — I499 Cardiac arrhythmia, unspecified: ICD-10-CM

## 2018-12-06 DIAGNOSIS — R0602 Shortness of breath: Principal | ICD-10-CM

## 2018-12-06 DIAGNOSIS — I10 Essential (primary) hypertension: Principal | ICD-10-CM

## 2018-12-06 DIAGNOSIS — I351 Nonrheumatic aortic (valve) insufficiency: ICD-10-CM

## 2018-12-06 DIAGNOSIS — I340 Nonrheumatic mitral (valve) insufficiency: ICD-10-CM

## 2018-12-06 LAB — C REACTIVE PROT-HI SENSITIVITY: Lab: >10 — ABNORMAL HIGH (ref <1.0)

## 2018-12-06 LAB — SED RATE: Lab: 30

## 2018-12-06 NOTE — Progress Notes
Date of Service: 12/06/2018    Joan Potter is a 78 y.o. female.       HPI     I saw Joan Potter today regarding her shoulder discomfort when she moves her head to the left, not to the right.  I think it is clearly musculoskeletal.  At her age, though, I am going to check a sed rate and C-reactive protein.  She has also had what she describes as some water weight recently.  I am going to check a BNP level.  Otherwise, she wants a relatively conservative management approach to her medical care and is hoping that she will be able to mow the lawn this year.    (XBM:[id number])             Vitals:    12/06/18 1519 12/06/18 1543   BP: 132/60 130/64   BP Source: Arm, Left Upper Arm, Right Upper   Pulse: 64    SpO2: 97%    Weight: 53.1 kg (117 lb)    Height: 1.499 m (4' 11)    PainSc: Zero      Body mass index is 23.63 kg/m???.     Past Medical History  Patient Active Problem List    Diagnosis Date Noted   ??? Essential hypertension 08/07/2018   ??? HLD (hyperlipidemia) 08/07/2018   ??? Cardiac pacemaker in situ 08/07/2018     Manufacturer: [employer]  ??? ???  Device Type:  DDD-PM ??? ???  Model Number: ASSURITY MRI [id number] DDDR ??? ???  Serial Number: [id number] ??? ???  Implant Date: 08/07/18           ??? Aortic valve regurgitation 08/07/2018   ??? Complete heart block (HCC) 08/06/2018         Review of Systems   Constitution: Negative.   HENT: Positive for hearing loss.    Eyes: Negative.    Cardiovascular: Negative.    Respiratory: Negative.    Endocrine: Negative.    Hematologic/Lymphatic: Negative.    Skin: Negative.    Musculoskeletal: Negative.    Gastrointestinal: Negative.    Genitourinary: Negative.    Neurological: Negative.    Psychiatric/Behavioral: Negative.    Allergic/Immunologic: Negative.        Physical Exam  Examination reveals a 78 year old lady in no acute distress.  She has a pacemaker in the left upper chest, slightly prominent wire insertion sites.  We are going to check a chest x-ray.  It is not tender.  No skin

## 2018-12-07 ENCOUNTER — Encounter: Admit: 2018-12-07 | Discharge: 2018-12-07 | Payer: MEDICARE

## 2018-12-07 DIAGNOSIS — R0602 Shortness of breath: Principal | ICD-10-CM

## 2018-12-07 DIAGNOSIS — I10 Essential (primary) hypertension: ICD-10-CM

## 2018-12-07 DIAGNOSIS — I351 Nonrheumatic aortic (valve) insufficiency: ICD-10-CM

## 2018-12-07 LAB — BNP (B-TYPE NATRIURETIC PEPTI): Lab: 25 (ref 3–12)

## 2018-12-11 ENCOUNTER — Encounter: Admit: 2018-12-11 | Discharge: 2018-12-11 | Payer: MEDICARE

## 2018-12-11 NOTE — Telephone Encounter
Results and recommendations called to patient.

## 2018-12-11 NOTE — Telephone Encounter
-----   Message from Hortencia Pilar, RN sent at 12/11/2018 10:31 AM CDT -----    ----- Message -----  From: Renetta Chalk, RN  Sent: 12/11/2018  10:18 AM CDT  To: Cvm Nurse [facility]      ----- Message -----  From: Laurence Aly, MD  Sent: 12/07/2018   2:28 PM CDT  To: Renetta Chalk, RN, Huel Coventry, RN    normal

## 2018-12-12 ENCOUNTER — Encounter: Admit: 2018-12-12 | Discharge: 2018-12-12 | Payer: MEDICARE

## 2019-01-21 ENCOUNTER — Ambulatory Visit: Payer: BLUE CROSS/BLUE SHIELD

## 2019-01-22 ENCOUNTER — Ambulatory Visit: Payer: MEDICARE | Attending: Hematology & Oncology

## 2019-01-22 ENCOUNTER — Ambulatory Visit: Payer: BLUE CROSS/BLUE SHIELD | Attending: Hematology & Oncology

## 2019-01-22 DIAGNOSIS — K1232 Oral mucositis (ulcerative) due to other drugs: Secondary | ICD-10-CM

## 2019-01-22 DIAGNOSIS — D708 Other neutropenia: Secondary | ICD-10-CM

## 2019-01-22 DIAGNOSIS — C8330 Diffuse large B-cell lymphoma, unspecified site: Secondary | ICD-10-CM

## 2019-01-22 DIAGNOSIS — D61810 Antineoplastic chemotherapy induced pancytopenia: Secondary | ICD-10-CM

## 2019-01-22 DIAGNOSIS — T451X5A Adverse effect of antineoplastic and immunosuppressive drugs, initial encounter: Secondary | ICD-10-CM

## 2019-01-22 MED ADMIN — ACETAMINOPHEN 325 MG PO TABS: 650 mg | ORAL | @ 18:00:00 | Stop: 2019-01-23

## 2019-01-22 MED ADMIN — RITUXIMAB (RITUXAN) INFUSION 500 ML: 592.5 mg | INTRAVENOUS | @ 18:00:00 | Stop: 2019-01-22

## 2019-01-22 MED ADMIN — DIPHENHYDRAMINE HCL 50 MG/ML IJ SOLN: 25 mg | INTRAVENOUS | @ 18:00:00 | Stop: 2019-01-23

## 2019-01-22 NOTE — Addendum Note
Addended by: Clayborne Dana LEE on: 01/22/2019 10:55 AM     Modules accepted: Orders

## 2019-01-22 NOTE — Progress Notes
Annette Hatfield returns for ongoing management of her recurrent non-Hodgkin's lymphoma.  She has decided to deviate from standard recommendations, and currently receiving single agent Rituxan every 2 months.  Overall has been doing well.  Has not noticed any lumps.  Energy level has been good.  Is getting a bit tired of the COVID-19 restrictions, and is hoping to travel to visit her husband in [place], although understands it currently is not safe.    Limited PHYSICAL EXAMINATION: Patient looks well.  VSS. Weight is stable.   HEENT: Sclerae anicteric.  EXTREMITIES: No edema.   SKIN: No rash, petechiae, ecchymosis.  NEUROLOGIC: Nonfocal.    LABS:  pending.    ASSESSMENT AND PLAN      Problem 1. Recurrent lymphoma:  She has done well on current therapy.  No clinical evidence of disease progression.  She does agree to having a PET scan done shortly, so will get that scheduled promptly.  If everything is stable, I have recommended continuing on with Rituxan.  She asked about switching to an every 3 month schedule.  I did tell her I certainly could not necessarily recommend this as there is no data, but if she feels strongly I would accommodate her wishes, understanding it is not standard of care.    [name], MD

## 2019-01-22 NOTE — Patient Instructions
Call me after the PET scan.

## 2019-01-22 NOTE — Nursing Note
Annette Hatfield, 78 y.o. female is here for chemotherapy infusion [id number]  Cycle: 1  S: Pt states she is doing well.      O: Dr. Salome Arnt visited and talked to pt before therapy.  Lab Results   Component Value Date    WBC 8.9 11/27/2018    HGB 14.3 11/27/2018    HCT 43.4 11/27/2018    MCV 99.5 (H) 11/27/2018    PLT 251 11/27/2018     @LASTANC @  @LASTCR @  BP Readings from Last 1 Encounters:   01/22/19 130/83         A:  Encounter Diagnoses   Name Primary?    Pancytopenia due to antineoplastic chemotherapy (HCC/RAF) Yes    Neutropenia associated with mucositis due to antineoplastic therapy (HCC/RAF)     Large cell (diffuse) non-Hodgkin's lymphoma (HCC/RAF)      Meets parameters to proceed with treatment.    P:  PIV to right arm after 2 attempts, established good blood return labs drawn as ordered.  Rituxan 592.5 mg given at variable rates,started with 197ml  Every 30 min to max rate of 400 ml. Pt  Tolerated.Confirmed appointment in 3 mos per pt request- Oct 06 .PIV removed pressure dressing applied. Discharged to self ambulatory, in stable condition.

## 2019-01-23 LAB — Lactate Dehydrogenase: LACTATE DEHYDROGENASE: 211 U/L (ref 125–256)

## 2019-01-23 LAB — Comprehensive Metabolic Panel
BILIRUBIN,TOTAL: 0.4 mg/dL (ref 0.1–1.2)
TOTAL PROTEIN: 6.7 g/dL (ref 6.1–8.2)

## 2019-01-29 ENCOUNTER — Ambulatory Visit: Payer: MEDICARE

## 2019-02-14 DIAGNOSIS — Z950 Presence of cardiac pacemaker: Principal | ICD-10-CM

## 2019-02-15 ENCOUNTER — Ambulatory Visit: Admit: 2019-02-15 | Discharge: 2019-02-15

## 2019-02-15 ENCOUNTER — Encounter: Admit: 2019-02-15 | Discharge: 2019-02-15

## 2019-02-15 DIAGNOSIS — I442 Atrioventricular block, complete: Secondary | ICD-10-CM

## 2019-02-25 ENCOUNTER — Ambulatory Visit: Payer: BLUE CROSS/BLUE SHIELD

## 2019-02-28 ENCOUNTER — Ambulatory Visit: Payer: MEDICARE

## 2019-04-16 ENCOUNTER — Ambulatory Visit: Payer: BLUE CROSS/BLUE SHIELD

## 2019-04-22 ENCOUNTER — Ambulatory Visit: Payer: BLUE CROSS/BLUE SHIELD

## 2019-04-23 ENCOUNTER — Ambulatory Visit: Payer: BLUE CROSS/BLUE SHIELD | Attending: Hematology & Oncology

## 2019-04-23 ENCOUNTER — Ambulatory Visit: Payer: MEDICARE | Attending: Hematology & Oncology

## 2019-04-23 DIAGNOSIS — D61810 Antineoplastic chemotherapy induced pancytopenia: Secondary | ICD-10-CM

## 2019-04-23 DIAGNOSIS — D701 Agranulocytosis secondary to cancer chemotherapy: Secondary | ICD-10-CM

## 2019-04-23 DIAGNOSIS — T451X5S Adverse effect of antineoplastic and immunosuppressive drugs, sequela: Secondary | ICD-10-CM

## 2019-04-23 DIAGNOSIS — C8330 Diffuse large B-cell lymphoma, unspecified site: Secondary | ICD-10-CM

## 2019-04-23 DIAGNOSIS — T451X5A Adverse effect of antineoplastic and immunosuppressive drugs, initial encounter: Secondary | ICD-10-CM

## 2019-04-23 DIAGNOSIS — K1231 Oral mucositis (ulcerative) due to antineoplastic therapy: Secondary | ICD-10-CM

## 2019-04-23 LAB — Comprehensive Metabolic Panel
ALKALINE PHOSPHATASE: 65 U/L (ref 37–113)
GFR ESTIMATE FOR NON-AFRICAN AMERICAN: 71 mL/min/{1.73_m2} (ref 3.6–5.3)

## 2019-04-23 LAB — Lactate Dehydrogenase: LACTATE DEHYDROGENASE: 209 U/L (ref 125–256)

## 2019-04-23 MED ADMIN — ACETAMINOPHEN 325 MG PO TABS: 650 mg | ORAL | @ 17:00:00 | Stop: 2019-04-24

## 2019-04-23 MED ADMIN — RITUXIMAB (RITUXAN) INFUSION 500 ML: 592.5 mg | INTRAVENOUS | @ 18:00:00 | Stop: 2019-04-23

## 2019-04-23 MED ADMIN — DIPHENHYDRAMINE HCL 50 MG/ML IJ SOLN: 25 mg | INTRAVENOUS | @ 17:00:00 | Stop: 2019-04-24

## 2019-04-23 NOTE — Progress Notes
Patient name:  Annette Hatfield  MRN:  [id number]  DOB:  November 04, 1940  CSN: [id number]  Date of encounter: 04/23/2019  Provider: Londell Moh. Nicholes Rough, MD    Subjective:  Annette Hatfield returns for ongoing management of her recurrent NHL. She presented with an unusual recurrence with subcutaneous lesions that were consistent with lymphoma. She did not want to proceed with chemotherapy and it was recommended she start on Revlimid and Rituxan. She took the Revlimid for a short period and developed toxicity, even at low doses, so she stopped the medication. She has decided to deviate from standard recommendations, and currently receiving single agent Rituxan every 3 months. She has been feeling well and has no complaints today. She was recently seen by her PCP with no new medication or issues. She has been staying safe during COVID and her son helps with all her shopping. She denies fever/chills, night sweats, lumps, CP, SOB, n/v, pain.      ???ECOG performance status: 0    Patient Active Problem List    Diagnosis Date Noted   ??? Flank pain 01/29/2018   ??? Night sweats 01/29/2018   ??? Diarrhea 09/25/2017     revlimid     ??? Skin lesion 06/30/2017   ??? Fatigue 10/28/2016   ??? Neuropathy 10/28/2016   ??? Pancytopenia due to antineoplastic chemotherapy (HCC/RAF) 08/09/2016   ??? Neutropenia associated with mucositis due to antineoplastic therapy 08/09/2016   ??? Large cell (diffuse) non-Hodgkin's lymphoma (HCC/RAF) 08/05/2016   ??? Postmenopausal HRT (hormone replacement therapy) 03/26/2013   ??? Vaginal atrophy 03/26/2013   ??? S/P TAH-BSO 03/26/2013   ??? Cystocele 03/26/2013       Objective:  Current medications  No outpatient medications have been marked as taking for the 04/23/19 encounter (Appointment) with Lucinda Dell., MD.       Allergies:  Allergies   Allergen Reactions   ??? Sulfa Antibiotics        Review of Systems:  14 item Normal ROS Constitutional: No fevers, chills, weight loss or appetite changes. Eyes: No recent blurry vision, double vision or visual changes.  ENT: No recent sinus pain or congestion. No sore throat or mouth sores.   Neck: No neck pain or stiffness.  Cardiovascular: No chest pain or pressure, no palpitations.   Pulmonary: No shortness of breath, no cough or wheezing.   Gastrointestinal: No abdominal pain, nausea, vomiting. No diarrhea.  No melena or BRBPR.   Genitourinary: No urinary frequency, urgency, or dysuria.   Musculoskeletal: No joint pain, muscle pain or back pain.   Neurologic: No headaches. No numbness, tingling or weakness.   Dermatologic: No rash, pruritus or recent skin changes.   Hematologic: No abnormal bruising or bleeding.   Endocrine: No polyuria, polydipsia, heat or cold intolerance.   Psychiatric: No depressive symptoms, no suicidal or homicidal ideation.       Vitals:  Wt Readings from Last 3 Encounters:   04/23/19 62.6 kg (138 lb)   01/22/19 62.1 kg (137 lb)   11/27/18 62.1 kg (137 lb)     Temp Readings from Last 3 Encounters:   04/23/19 36.1 ???C (96.9 ???F)   01/22/19 36 ???C (96.8 ???F) (Skin)   11/27/18 36.6 ???C (97.8 ???F) (Forehead)     BP Readings from Last 3 Encounters:   04/23/19 134/78   01/22/19 130/83   11/27/18 141/79     Pulse Readings from Last 3 Encounters:   04/23/19 98   01/22/19 90   11/27/18 94  Physical Exam:  General: Appears well-developed, well-nourished and close to stated age. Weight stable.   Head: Normocephalic, atraumatic.  Eyes: PERRL without icterus.   Chest: Respiratory effort appears normal.   Abdomen: Soft, nontender and nondistended.   Musculoskeletal: No edema. No cyanosis. Extremities are warm and well-perfused.   Hematologic: No bruising, purpura or petechiae are noted.   Dermatologic: No rashes appreciated. The subcutaneous nodules have clinically resolved.  Lymph nodes: No palpable cervical, SC, axillary or epitrochlear adenopathy.    Psychiatric: Affect appropriate.  Pleasant and conversant.     Recent Labs:  Lab Results Component Value Date    WBC 8.1 04/23/2019    HGB 14.0 04/23/2019    HCT 42.0 04/23/2019    MCV 97.9 (H) 04/23/2019    PLT 268 04/23/2019    NEUTPCT 72.2 (H) 04/23/2019    NEUTABS 5.88 04/23/2019    MONOPCT 9.5 04/23/2019    MONOABS 0.77 04/23/2019     Lab Results   Component Value Date    NA 140 01/22/2019    K 4.7 01/22/2019    CL 105 01/22/2019    CO2 21 01/22/2019    CREAT 0.85 01/22/2019    BUN 20 01/22/2019    GLUCOSE 110 (H) 01/22/2019    CALCIUM 9.5 01/22/2019    PHOS 3.0 08/29/2017     Lab Results   Component Value Date    TOTPRO 6.7 01/22/2019    ALBUMIN 4.5 01/22/2019    BILITOT 0.4 01/22/2019    AST 25 01/22/2019    ALT 17 01/22/2019    ALKPHOS 60 01/22/2019       Treatment Plan       Pertinent Imaging/Pathology:    Impression and Recommendations:  Diagnoses and all orders for this visit:    Diffuse large B cell lymphoma:  Pt who presented with a 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  She did have a complete response to modified chemotherapy and radiation.  Unfortunately within a year she developed some small subcutaneous nodules that are consistent with recurrent DLBCL. She consequently was started on a somewhat unconventional approach of Revlimid and Rituxan. She did quickly have a significant clinical response with resolution of the subcutaneous nodules. She unfortunately had a difficult time tolerating Revlimid, even with modified dose, and she stopped the medication. She has continued therapy with Rituxan, however has decided she only wants to receive every 3 months. She understands this is not the standard of care. PET scan in August with no progressive disease. She does continue therapy with her naturopath. She is feeling well today with no new complaints. Nothing suspicious clinically. Will proceed with Rituxan today. She will return in 3 months or sooner if needed. ?    Return to clinic:  3 months.          Attending Physician: Londell Moh. Nicholes Rough, MD. Thereasa Parkin: Stann Mainland. Velda Shell, NP               I have independently reviewed this patient's medications, and current issues, and formulated the plan in conjunction with the nurse practitioner.  The note has been reviewed by me and reflects the nature of our visit and accurately defines our plan.  Jacory Kamel

## 2019-04-23 NOTE — Nursing Note
Pt returns here for maintenance treatment of Rituxan every 3 months.Pt feels well. No new complaints. PIV started , labs drawn and processed. NP Sharyn Lull is aware of pt weight increase. Per NP Sharyn Lull keep the same dose.Premeds given as ordered- Tylenol and Benadryl.   Administrations This Visit     acetaminophen tab 650 mg     Admin Date  04/23/2019 Action  Given Dose  650 mg Route  Oral Administered By  Dene Gentry, RN          diphenhydrAMINE 50 mg/mL inj 25 mg     Admin Date  04/23/2019 Action  Given Dose  25 mg Route  IV Push Administered By  Dene Gentry, RN          riTUXimab (Rituxan) 592.5 mg in sodium chloride 0.9% 609.25 mL IVPB     Admin Date  04/23/2019 Action  New Bag/ Syringe/ Cartridge Dose  592.5 mg Route  Intravenous Administered By  Dene Gentry, RN            Rituxan 592 mg is administered at variable rate with 8 mg wasted. Pt toleratedi nfusion without incident. PIV removed,pressure dressing applied. Confirmed appt in Jan. 05,2021 per pt request. Pt is discharged in stable condition,ambulatory.

## 2019-04-23 NOTE — Patient Instructions
Call with any issues of concern.

## 2019-05-16 ENCOUNTER — Ambulatory Visit: Admit: 2019-05-16 | Discharge: 2019-05-16 | Payer: MEDICARE

## 2019-05-16 DIAGNOSIS — Z950 Presence of cardiac pacemaker: Secondary | ICD-10-CM

## 2019-05-16 DIAGNOSIS — I442 Atrioventricular block, complete: Secondary | ICD-10-CM

## 2019-05-17 ENCOUNTER — Encounter: Admit: 2019-05-17 | Discharge: 2019-05-17 | Payer: MEDICARE

## 2019-05-17 DIAGNOSIS — Z950 Presence of cardiac pacemaker: Secondary | ICD-10-CM

## 2019-06-18 ENCOUNTER — Encounter: Admit: 2019-06-18 | Discharge: 2019-06-18 | Payer: MEDICARE

## 2019-06-18 DIAGNOSIS — Z950 Presence of cardiac pacemaker: Secondary | ICD-10-CM

## 2019-06-18 DIAGNOSIS — I442 Atrioventricular block, complete: Secondary | ICD-10-CM

## 2019-07-01 ENCOUNTER — Telehealth: Payer: BLUE CROSS/BLUE SHIELD

## 2019-07-01 NOTE — Telephone Encounter
Good Morning,    Appointment Accommodation Request    MD Name: Dr. Salome Arnt    Appointment Type: follow-up and infusion     Reason for sooner request: patient would like to reschedule her upcoming infusion. She advised that she won't be available within the next two hours and would like a call back in the afternoon    Date/Time Requested (If any): no date and time mentioned     Last seen by MD: 04/23/19    Any Symptoms:  []  Yes  [x]  No      ? If yes, what symptoms are you experiencing:   o Duration of symptoms (how long):     Patient was offered an appointment but declined.    Patient was advised to seek emergency services if conditions are urgent or emergent.    Patient has been notified of the 24-48 hour turnaround time.    CBN: [phone]

## 2019-07-02 NOTE — Telephone Encounter
Updated appt in [facility]

## 2019-07-02 NOTE — Telephone Encounter
Spoke with Annette Hatfield. She wanted to postpone her treatment due to [place]. States she does not feel safe to come in at this time. Rescheduled 07/23/19 to 08/26/19 @ 1000. Rituxan

## 2019-07-23 ENCOUNTER — Ambulatory Visit: Payer: BLUE CROSS/BLUE SHIELD | Attending: Hematology & Oncology

## 2019-07-23 ENCOUNTER — Ambulatory Visit: Payer: MEDICARE | Attending: Hematology & Oncology

## 2019-08-09 ENCOUNTER — Telehealth: Payer: MEDICARE

## 2019-08-09 NOTE — Telephone Encounter
Annette Hatfield,    Pt would like to discuss her future treatment. Please call her at [phone] (pt knows you will be in until Tuesday).    Thank you

## 2019-08-13 ENCOUNTER — Telehealth: Payer: MEDICARE

## 2019-08-13 NOTE — Telephone Encounter
Spoke with Annette Hatfield. She is scheduled for Rituxan in a few weeks. She would prefer to hold Rituxan at this time. She is working on building her immune system and is worried with Covid. She is due for her PET scan and we will send orders. We will schedule her for follow-up to discuss results of PET and plan going forward.

## 2019-08-13 NOTE — Telephone Encounter
ONLINE-submitted order to [facility] for a PET scan due in 2-3 weeks. Faxed clinicals. Order# [id number].

## 2019-08-13 NOTE — Telephone Encounter
Call Back Request    MD:  Dr. Salome Arnt     Reason for call back: Pt calling in to remind pt that she is waiting for her request for CB from last week in regards to her treatment. Pt states she will not be available tomorrow so she expects a call back today. Please advise.     Any Symptoms:  []  Yes  [x]  No      ? If yes, what symptoms are you experiencing:    o Duration of symptoms (how long):    o Have you taken medication for symptoms (OTC or Rx):      Patient or caller has been notified of the 24-48 hour turnaround time.

## 2019-08-13 NOTE — Telephone Encounter
See other telephone message from today 

## 2019-08-15 ENCOUNTER — Ambulatory Visit: Admit: 2019-08-15 | Discharge: 2019-08-15 | Payer: MEDICARE

## 2019-08-15 DIAGNOSIS — Z950 Presence of cardiac pacemaker: Secondary | ICD-10-CM

## 2019-08-15 DIAGNOSIS — I442 Atrioventricular block, complete: Secondary | ICD-10-CM

## 2019-08-16 ENCOUNTER — Ambulatory Visit: Payer: MEDICARE

## 2019-08-16 DIAGNOSIS — Z23 Encounter for immunization: Secondary | ICD-10-CM

## 2019-08-19 ENCOUNTER — Ambulatory Visit: Payer: BLUE CROSS/BLUE SHIELD

## 2019-08-23 ENCOUNTER — Ambulatory Visit: Payer: MEDICARE

## 2019-08-23 NOTE — Progress Notes
Rescheduled - patient called to reschedule.  Wants to hold Rituxan for now.  We have discussed importance of ongoing treatment in the past and she understands risks.

## 2019-08-23 NOTE — Telephone Encounter
Dr. Salome Arnt,  Patient is cancelling apt/ and treatment for Monday 2/8 as she would prefer to hold Rituxan right now and work on building up immune system. F/U PET scan is scheduled for 2/22. Please advise when ok to cancel.   Thank you

## 2019-08-26 ENCOUNTER — Ambulatory Visit: Payer: MEDICARE | Attending: Hematology & Oncology

## 2019-09-06 NOTE — Progress Notes
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  She has received multiple therapies, most recently was receiving single agent rituximab.  Overall has done well.  Did have a recent PET scan showing no evidence of recurrence.***    PHYSICAL EXAMINATION: Patient looks well.  VSS. Weight is stable.   HEENT: Sclerae anicteric.  LYMPH NODES: No epitrochlear, axillary, cervical, or supraclavicular nodes.   HEART: RRR, normal S1 & S2.  LUNGS: CTAB.  ABDOMEN: Benign with no organomegaly.  EXTREMITIES: No edema.   SKIN: No rash, petechiae, ecchymosis.  NEUROLOGIC: Nonfocal.    LABS: ***    PET scan as above.    ASSESSMENT AND PLAN      Problem 1. ***    Remo Lipps H. Salome Arnt, MD

## 2019-09-09 ENCOUNTER — Ambulatory Visit: Payer: BLUE CROSS/BLUE SHIELD | Attending: Hematology & Oncology

## 2019-09-09 ENCOUNTER — Telehealth: Payer: MEDICARE

## 2019-09-09 NOTE — Telephone Encounter
ONLINE ORDER SENT TO [facility].

## 2019-09-09 NOTE — Patient Instructions
PET scan in 4 months

## 2019-09-10 ENCOUNTER — Telehealth: Payer: MEDICARE

## 2019-09-10 ENCOUNTER — Encounter: Admit: 2019-09-10 | Discharge: 2019-09-10 | Payer: MEDICARE

## 2019-09-10 ENCOUNTER — Ambulatory Visit: Admit: 2019-09-10 | Discharge: 2019-09-10 | Payer: MEDICARE

## 2019-09-10 DIAGNOSIS — Z950 Presence of cardiac pacemaker: Secondary | ICD-10-CM

## 2019-09-10 NOTE — Telephone Encounter
Online - Sent order to [facility] for a PET (due 72M )

## 2019-09-24 ENCOUNTER — Encounter: Admit: 2019-09-24 | Discharge: 2019-09-24 | Payer: MEDICARE

## 2020-01-02 ENCOUNTER — Telehealth: Payer: BLUE CROSS/BLUE SHIELD

## 2020-01-02 ENCOUNTER — Encounter: Admit: 2020-01-02 | Discharge: 2020-01-02 | Payer: MEDICARE

## 2020-01-02 ENCOUNTER — Ambulatory Visit: Admit: 2020-01-02 | Discharge: 2020-01-02 | Payer: MEDICARE

## 2020-01-02 DIAGNOSIS — Z950 Presence of cardiac pacemaker: Secondary | ICD-10-CM

## 2020-01-02 NOTE — Telephone Encounter
Message to Practice/Provider      Message: Lily with [facility] is requesting to be sent clinicals asap for the patient pet s can that's scheduled for tomorrow.     Return call is not being requested by the patient or caller.    Patient or caller has been notified of the 24-48 hour processing turnaround time if applicable.    Fax: [phone] attn: Tim Lair    Thank You

## 2020-01-02 NOTE — Telephone Encounter
Faxed most recent labs/note.

## 2020-01-06 ENCOUNTER — Telehealth: Payer: MEDICARE

## 2020-01-06 NOTE — Telephone Encounter
Called patient and relayed message that PET scan is negative for recurrence. Patient stated no new issues so she will hold off on return visit for now. Patient verbalized understanding.

## 2020-01-06 NOTE — Telephone Encounter
Call Back Request    Reason for call back:  Pt would like a call back to discuss her Pet Scan results.  She had an appt on 01/07/20 that she cancelled, because she could not make it.  Pls let her know or if she needs to reschedule.  CBN: [phone]    Any Symptoms:  []  Yes  [x]  No      ? If yes, what symptoms are you experiencing:    o Duration of symptoms (how long):    o Have you taken medication for symptoms (OTC or Rx):      Patient or caller has been notified of the 24-48 hour turnaround time.

## 2020-01-06 NOTE — Telephone Encounter
Called patient, left voicemail relaying message.

## 2020-01-06 NOTE — Telephone Encounter
PET scan negative for recurrence.  Will let her know.  Would favor rescheduling her visit.

## 2020-01-07 ENCOUNTER — Ambulatory Visit: Payer: MEDICARE | Attending: Hematology & Oncology

## 2020-01-16 ENCOUNTER — Telehealth: Payer: MEDICARE

## 2020-01-16 NOTE — Telephone Encounter
Spoke to [facility] regarding PET results with no evidence of recurrent lymphoma. She had already received a call from our office with the results but she was happy to hear from [place] again. Going forward she wishes to hold off on returning to our clinic for follow up visits. She explained that her PCP,  Dr. Harden Mo is aware and reportedly is agreeable to order her PET scans if needed. She expressed how grateful she is for the care she has received in our office.

## 2020-02-06 ENCOUNTER — Encounter: Admit: 2020-02-06 | Discharge: 2020-02-06 | Payer: MEDICARE

## 2020-02-06 DIAGNOSIS — I499 Cardiac arrhythmia, unspecified: Secondary | ICD-10-CM

## 2020-02-06 DIAGNOSIS — I340 Nonrheumatic mitral (valve) insufficiency: Secondary | ICD-10-CM

## 2020-02-06 DIAGNOSIS — I10 Essential (primary) hypertension: Secondary | ICD-10-CM

## 2020-02-13 ENCOUNTER — Encounter: Admit: 2020-02-13 | Discharge: 2020-02-13 | Payer: MEDICARE

## 2020-05-14 ENCOUNTER — Encounter: Admit: 2020-05-14 | Discharge: 2020-05-14 | Payer: MEDICARE

## 2020-07-11 IMAGING — CR CHEST
1 series · 1 of 1 positions shown · non-contrast
Comparison: none

[chest port x-wise]
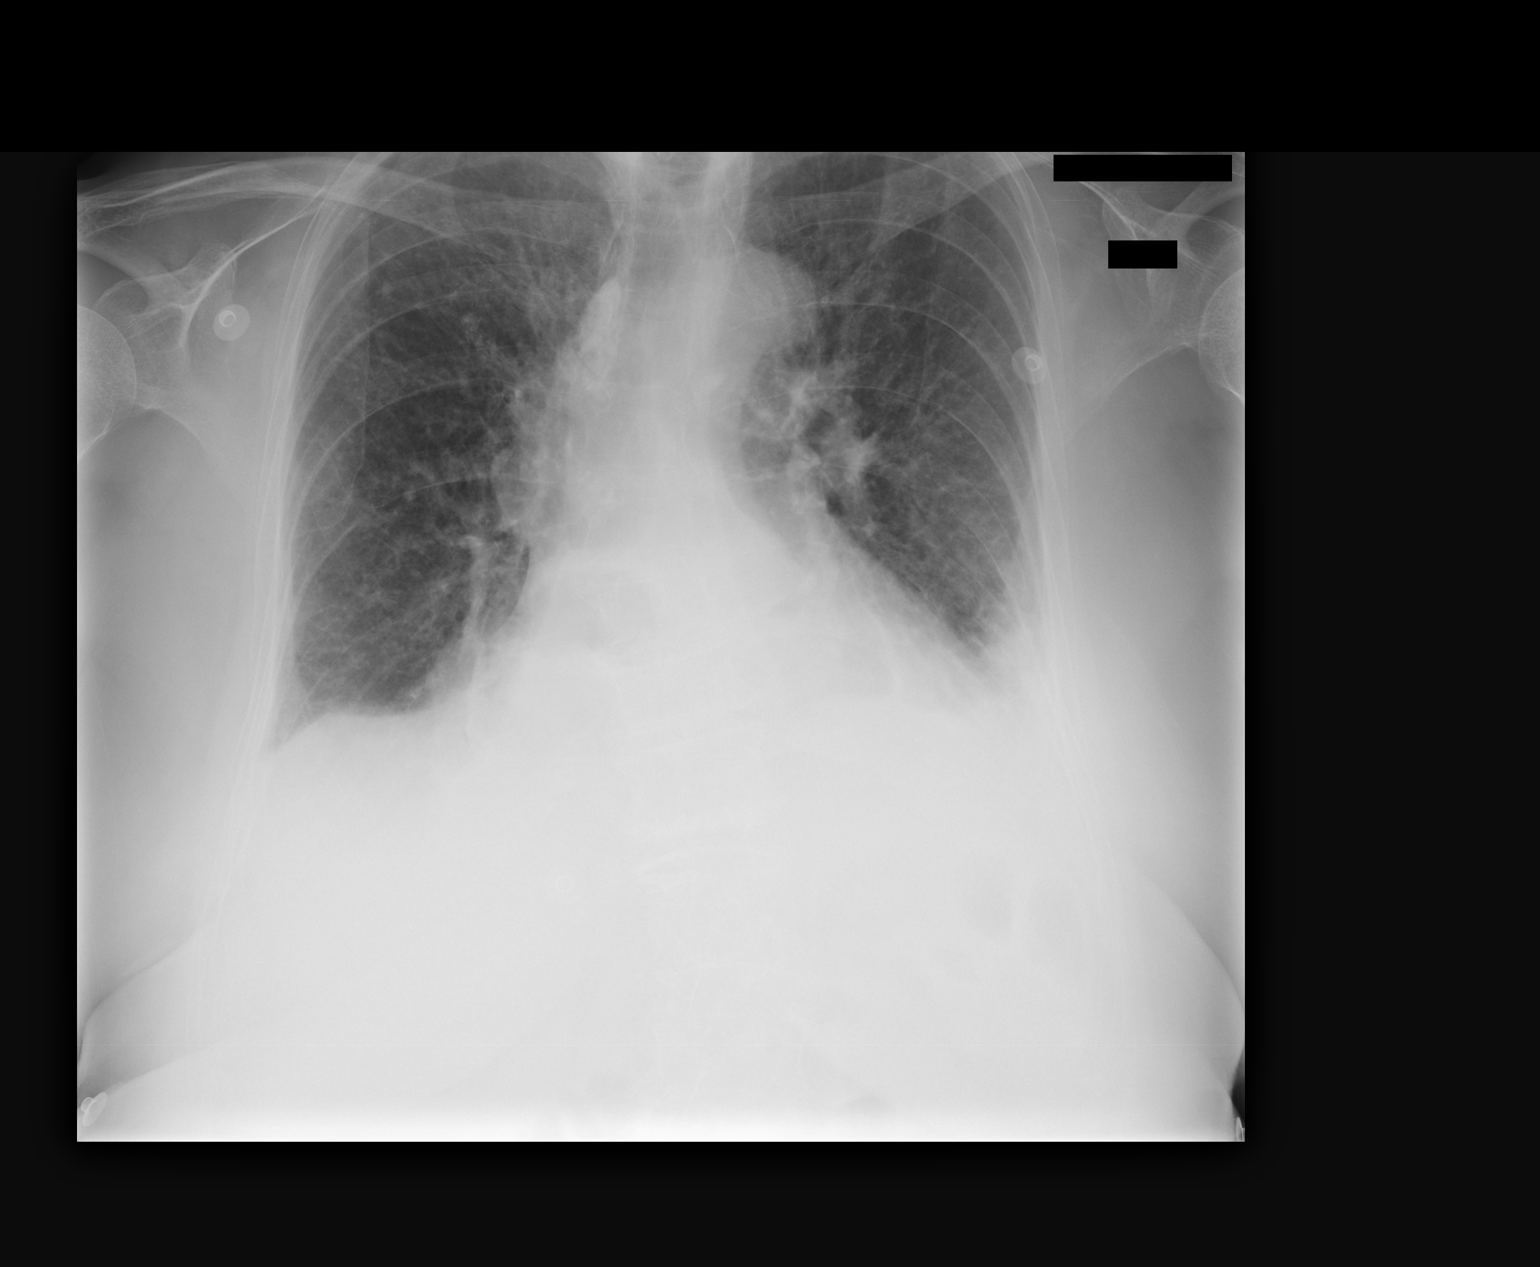

[1 of 1 positions shown; findings below may reference images not displayed]

EXAM

RADIOLOGICAL EXAMINATION, CHEST; SINGLE VIEW, FRONTAL CPT 69595

INDICATION

Bradycardia

TECHNIQUE

1 view of the chest was acquired.

COMPARISONS

There are no previous examinations available for comparison at the time of dictation.

FINDINGS

The cardiac silhouette is enlarged.There are diffuse chronic interstitial changes. The pulmonary
vasculature is congested centrally.  There is blunting of the left costophrenic angle suspicious for
a left-sided pleural effusion. Underlying atelectasis/consolidation is likely present as well. The
right costophrenic angle is preserved.

IMPRESSION

Cardiomegaly. COPD. Left-sided pleural effusion. Underlying atelectasis/consolidation is likely
present as well.

Tech Notes:

bradycardia

## 2020-08-05 ENCOUNTER — Encounter: Admit: 2020-08-05 | Discharge: 2020-08-05 | Payer: MEDICARE

## 2020-08-13 ENCOUNTER — Encounter: Admit: 2020-08-13 | Discharge: 2020-08-13 | Payer: MEDICARE

## 2020-08-13 DIAGNOSIS — I351 Nonrheumatic aortic (valve) insufficiency: Secondary | ICD-10-CM

## 2020-08-13 DIAGNOSIS — I340 Nonrheumatic mitral (valve) insufficiency: Secondary | ICD-10-CM

## 2020-08-13 DIAGNOSIS — I10 Essential (primary) hypertension: Secondary | ICD-10-CM

## 2020-08-13 DIAGNOSIS — R011 Cardiac murmur, unspecified: Secondary | ICD-10-CM

## 2020-08-13 DIAGNOSIS — E785 Hyperlipidemia, unspecified: Secondary | ICD-10-CM

## 2020-08-13 DIAGNOSIS — I499 Cardiac arrhythmia, unspecified: Secondary | ICD-10-CM

## 2020-08-13 NOTE — Patient Instructions
Echocardiogram

## 2020-08-14 ENCOUNTER — Encounter: Admit: 2020-08-14 | Discharge: 2020-08-14 | Payer: MEDICARE

## 2020-08-27 ENCOUNTER — Encounter

## 2020-08-27 DIAGNOSIS — I351 Nonrheumatic aortic (valve) insufficiency: Secondary | ICD-10-CM

## 2020-08-27 DIAGNOSIS — R011 Cardiac murmur, unspecified: Secondary | ICD-10-CM

## 2020-08-27 DIAGNOSIS — E785 Hyperlipidemia, unspecified: Secondary | ICD-10-CM

## 2020-08-27 DIAGNOSIS — I10 Essential (primary) hypertension: Secondary | ICD-10-CM

## 2020-08-28 NOTE — Telephone Encounter
Called and discussed results with patient.  No questions at this time.  Pt will callback with any questions, concerns or problems.

## 2020-08-28 NOTE — Telephone Encounter
-----   Message from Ishmael Holter, RN sent at 08/28/2020  7:41 AM CST -----    ----- Message -----  From: Laurence Aly, MD  Sent: 08/28/2020   6:19 AM CST  To: Renetta Chalk, RN    Let pt know the vavle leak is just something to watch now not severe.

## 2020-11-17 ENCOUNTER — Encounter: Admit: 2020-11-17 | Discharge: 2020-11-17 | Payer: MEDICARE

## 2021-02-09 ENCOUNTER — Encounter: Admit: 2021-02-09 | Discharge: 2021-02-09 | Payer: MEDICARE

## 2021-02-09 ENCOUNTER — Ambulatory Visit: Admit: 2021-02-09 | Discharge: 2021-02-09 | Payer: MEDICARE

## 2021-02-09 DIAGNOSIS — Z950 Presence of cardiac pacemaker: Secondary | ICD-10-CM

## 2021-02-09 DIAGNOSIS — I442 Atrioventricular block, complete: Secondary | ICD-10-CM

## 2021-02-11 ENCOUNTER — Encounter: Admit: 2021-02-11 | Discharge: 2021-02-11 | Payer: MEDICARE

## 2021-02-11 DIAGNOSIS — I442 Atrioventricular block, complete: Principal | ICD-10-CM

## 2021-02-11 DIAGNOSIS — I340 Nonrheumatic mitral (valve) insufficiency: Secondary | ICD-10-CM

## 2021-02-11 DIAGNOSIS — E785 Hyperlipidemia, unspecified: Secondary | ICD-10-CM

## 2021-02-11 DIAGNOSIS — I10 Essential (primary) hypertension: Secondary | ICD-10-CM

## 2021-02-11 DIAGNOSIS — I351 Nonrheumatic aortic (valve) insufficiency: Secondary | ICD-10-CM

## 2021-02-11 DIAGNOSIS — I499 Cardiac arrhythmia, unspecified: Secondary | ICD-10-CM

## 2021-02-11 NOTE — Patient Instructions
Follow up with Dr. Geronimo Boot in the Spring of 2023. Have an echocardiogram beforehand

## 2021-03-05 ENCOUNTER — Encounter: Admit: 2021-03-05 | Discharge: 2021-03-05 | Payer: MEDICARE

## 2021-03-05 DIAGNOSIS — Z950 Presence of cardiac pacemaker: Secondary | ICD-10-CM

## 2021-03-05 DIAGNOSIS — I442 Atrioventricular block, complete: Secondary | ICD-10-CM

## 2021-05-14 ENCOUNTER — Encounter: Admit: 2021-05-14 | Discharge: 2021-05-14 | Payer: MEDICARE

## 2021-08-12 ENCOUNTER — Encounter: Admit: 2021-08-12 | Discharge: 2021-08-12 | Payer: MEDICARE

## 2021-11-11 ENCOUNTER — Encounter: Admit: 2021-11-11 | Discharge: 2021-11-11 | Payer: MEDICARE

## 2021-11-11 ENCOUNTER — Ambulatory Visit: Admit: 2021-11-11 | Discharge: 2021-11-11 | Payer: MEDICARE

## 2021-11-11 DIAGNOSIS — Z950 Presence of cardiac pacemaker: Secondary | ICD-10-CM

## 2021-11-11 DIAGNOSIS — I10 Essential (primary) hypertension: Secondary | ICD-10-CM

## 2021-11-11 DIAGNOSIS — I351 Nonrheumatic aortic (valve) insufficiency: Secondary | ICD-10-CM

## 2021-11-11 DIAGNOSIS — E785 Hyperlipidemia, unspecified: Secondary | ICD-10-CM

## 2021-11-11 DIAGNOSIS — I442 Atrioventricular block, complete: Secondary | ICD-10-CM

## 2021-11-11 DIAGNOSIS — Z136 Encounter for screening for cardiovascular disorders: Secondary | ICD-10-CM

## 2021-11-11 DIAGNOSIS — I340 Nonrheumatic mitral (valve) insufficiency: Secondary | ICD-10-CM

## 2021-11-11 DIAGNOSIS — I499 Cardiac arrhythmia, unspecified: Secondary | ICD-10-CM

## 2021-11-11 NOTE — Patient Instructions
Thank you for visiting our office today.    We would like to make the following medication adjustments:  NONE      Otherwise continue the same medications as you have been doing.          We will be pursuing the following tests after your appointment today:       Orders Placed This Encounter    ECG 12-LEAD    2D + DOPPLER ECHO         We will plan to see you back in 6 months.  Please call us in the meantime with any questions or concerns.        Please allow 5-7 business days for our providers to review your results. All normal results will go to MyChart. If you do not have Mychart, it is strongly recommended to get this so you can easily view all your results. If you do not have mychart, we will attempt to call you once with normal lab and testing results. If we cannot reach you by phone with normal results, we will send you a letter.  If you have not heard the results of your testing after one week please give us a call.       Your [facility] [place] Team (Steve, Lisa, Jamie, Melanie, and Noelani Harbach)  phone number is [phone].

## 2021-11-22 ENCOUNTER — Ambulatory Visit: Admit: 2021-11-22 | Discharge: 2021-11-22 | Payer: MEDICARE

## 2021-11-22 ENCOUNTER — Encounter: Admit: 2021-11-22 | Discharge: 2021-11-22 | Payer: MEDICARE

## 2021-11-22 DIAGNOSIS — Z136 Encounter for screening for cardiovascular disorders: Secondary | ICD-10-CM

## 2021-11-22 DIAGNOSIS — I10 Essential (primary) hypertension: Secondary | ICD-10-CM

## 2021-12-16 ENCOUNTER — Encounter: Admit: 2021-12-16 | Discharge: 2021-12-16 | Payer: MEDICARE

## 2021-12-16 NOTE — Telephone Encounter
-----   Message from Ishmael Holter, RN sent at 12/16/2021  3:06 PM CDT -----    ----- Message -----  From: Laurence Aly, MD  Sent: 12/16/2021   3:06 PM CDT  To: Ishmael Holter, RN; Renetta Chalk, RN; #    Let pt know the echo is unchanged and be sure we have a follow up arranged.

## 2021-12-16 NOTE — Telephone Encounter
Left message with results and recommendations.  Pt has follow up scheduled in October.

## 2022-01-15 DEATH — deceased

## 2022-01-25 ENCOUNTER — Encounter: Admit: 2022-01-25 | Discharge: 2022-01-25 | Payer: MEDICARE

## 2022-01-25 ENCOUNTER — Ambulatory Visit: Admit: 2022-01-25 | Discharge: 2022-01-25 | Payer: MEDICARE

## 2022-01-25 DIAGNOSIS — I442 Atrioventricular block, complete: Secondary | ICD-10-CM

## 2022-02-15 ENCOUNTER — Encounter: Admit: 2022-02-15 | Discharge: 2022-02-15 | Payer: MEDICARE

## 2022-05-11 ENCOUNTER — Encounter: Admit: 2022-05-11 | Discharge: 2022-05-11 | Payer: MEDICARE

## 2022-05-12 ENCOUNTER — Encounter: Admit: 2022-05-12 | Discharge: 2022-05-12 | Payer: MEDICARE

## 2022-05-12 DIAGNOSIS — I10 Essential (primary) hypertension: Secondary | ICD-10-CM

## 2022-05-12 DIAGNOSIS — I351 Nonrheumatic aortic (valve) insufficiency: Secondary | ICD-10-CM

## 2022-05-12 DIAGNOSIS — E785 Hyperlipidemia, unspecified: Secondary | ICD-10-CM

## 2022-05-12 DIAGNOSIS — I499 Cardiac arrhythmia, unspecified: Secondary | ICD-10-CM

## 2022-05-12 DIAGNOSIS — I340 Nonrheumatic mitral (valve) insufficiency: Secondary | ICD-10-CM

## 2022-05-16 ENCOUNTER — Encounter: Admit: 2022-05-16 | Discharge: 2022-05-16 | Payer: MEDICARE

## 2022-08-16 ENCOUNTER — Encounter: Admit: 2022-08-16 | Discharge: 2022-08-16 | Payer: MEDICARE

## 2022-11-01 ENCOUNTER — Encounter: Admit: 2022-11-01 | Discharge: 2022-11-01 | Payer: MEDICARE

## 2022-11-01 DIAGNOSIS — I351 Nonrheumatic aortic (valve) insufficiency: Secondary | ICD-10-CM

## 2022-11-01 DIAGNOSIS — I340 Nonrheumatic mitral (valve) insufficiency: Secondary | ICD-10-CM

## 2022-11-01 DIAGNOSIS — I499 Cardiac arrhythmia, unspecified: Secondary | ICD-10-CM

## 2022-11-01 DIAGNOSIS — E785 Hyperlipidemia, unspecified: Secondary | ICD-10-CM

## 2022-11-01 DIAGNOSIS — I10 Essential (primary) hypertension: Secondary | ICD-10-CM

## 2022-11-01 DIAGNOSIS — I442 Atrioventricular block, complete: Secondary | ICD-10-CM

## 2022-11-01 NOTE — Patient Instructions
Thank you for visiting our office today.    We would like to make the following medication adjustments:  NONE       Otherwise continue the same medications as you have been doing.          We will be pursuing the following tests after your appointment today:            We will plan to see you back in 6 months.  Please call us in the meantime with any questions or concerns.        Please allow 5-7 business days for our providers to review your results. All normal results will go to MyChart. If you do not have Mychart, it is strongly recommended to get this so you can easily view all your results. If you do not have mychart, we will attempt to call you once with normal lab and testing results. If we cannot reach you by phone with normal results, we will send you a letter.  If you have not heard the results of your testing after one week please give us a call.       Your [facility] [place] Team (Steve, Lisa, Jamie, Melanie, and [place] Knights)  phone number is [phone].

## 2022-11-11 ENCOUNTER — Encounter: Admit: 2022-11-11 | Discharge: 2022-11-11 | Payer: MEDICARE

## 2022-11-23 ENCOUNTER — Encounter: Admit: 2022-11-23 | Discharge: 2022-11-23 | Payer: MEDICARE

## 2022-11-23 ENCOUNTER — Ambulatory Visit: Admit: 2022-11-23 | Discharge: 2022-11-23 | Payer: MEDICARE

## 2022-11-23 DIAGNOSIS — I10 Essential (primary) hypertension: Secondary | ICD-10-CM

## 2023-01-20 ENCOUNTER — Encounter: Admit: 2023-01-20 | Discharge: 2023-01-20 | Payer: MEDICARE

## 2023-01-20 DIAGNOSIS — Z950 Presence of cardiac pacemaker: Secondary | ICD-10-CM

## 2023-02-02 ENCOUNTER — Encounter: Admit: 2023-02-02 | Discharge: 2023-02-02 | Payer: MEDICARE

## 2023-02-02 ENCOUNTER — Ambulatory Visit: Admit: 2023-02-02 | Discharge: 2023-02-02 | Payer: MEDICARE

## 2023-02-02 DIAGNOSIS — I442 Atrioventricular block, complete: Secondary | ICD-10-CM

## 2023-02-09 ENCOUNTER — Encounter: Admit: 2023-02-09 | Discharge: 2023-02-09 | Payer: MEDICARE

## 2023-05-08 ENCOUNTER — Encounter: Admit: 2023-05-08 | Discharge: 2023-05-08 | Payer: MEDICARE

## 2023-05-09 ENCOUNTER — Encounter: Admit: 2023-05-09 | Discharge: 2023-05-09 | Payer: MEDICARE

## 2023-05-09 DIAGNOSIS — E785 Hyperlipidemia, unspecified: Secondary | ICD-10-CM

## 2023-05-09 DIAGNOSIS — I499 Cardiac arrhythmia, unspecified: Secondary | ICD-10-CM

## 2023-05-09 DIAGNOSIS — R079 Chest pain, unspecified: Secondary | ICD-10-CM

## 2023-05-09 DIAGNOSIS — I10 Essential (primary) hypertension: Secondary | ICD-10-CM

## 2023-05-09 DIAGNOSIS — I340 Nonrheumatic mitral (valve) insufficiency: Secondary | ICD-10-CM

## 2023-05-09 DIAGNOSIS — Z950 Presence of cardiac pacemaker: Secondary | ICD-10-CM

## 2023-05-09 NOTE — Patient Instructions
Thank you for visiting our office today.    We would like to make the following medication adjustments:  NONE       Otherwise continue the same medications as you have been doing.          We will be pursuing the following tests after your appointment today:       Orders Placed This Encounter    CHEST 2 VIEWS         Please call us in the meantime with any questions or concerns.        Please allow 5-7 business days for our providers to review your results. All normal results will go to MyChart. If you do not have Mychart, it is strongly recommended to get this so you can easily view all your results. If you do not have mychart, we will attempt to call you once with normal lab and testing results. If we cannot reach you by phone with normal results, we will send you a letter.  If you have not heard the results of your testing after one week please give [place] a call.       Your [facility] [place] Team Brett Canales, Pilar Jarvis, Shawna Orleans, and [place])  phone number is [phone].

## 2023-05-09 NOTE — Telephone Encounter
Dr. Geronimo Boot reviewed report and imaging from chest x-ray and would like patient to submit a remote device check.

## 2023-05-10 ENCOUNTER — Encounter: Admit: 2023-05-10 | Discharge: 2023-05-10 | Payer: MEDICARE

## 2023-08-10 ENCOUNTER — Ambulatory Visit: Admit: 2023-08-10 | Discharge: 2023-08-11 | Payer: MEDICARE

## 2023-08-10 ENCOUNTER — Encounter: Admit: 2023-08-10 | Discharge: 2023-08-10 | Payer: MEDICARE

## 2023-11-10 ENCOUNTER — Encounter: Admit: 2023-11-10 | Discharge: 2023-11-10 | Payer: MEDICARE

## 2024-01-15 ENCOUNTER — Encounter: Admit: 2024-01-15 | Discharge: 2024-01-15 | Payer: MEDICARE

## 2024-01-15 DIAGNOSIS — I442 Atrioventricular block, complete: Secondary | ICD-10-CM

## 2024-01-15 DIAGNOSIS — Z950 Presence of cardiac pacemaker: Principal | ICD-10-CM

## 2024-02-15 ENCOUNTER — Encounter: Admit: 2024-02-15 | Discharge: 2024-02-15 | Payer: MEDICARE

## 2024-04-24 ENCOUNTER — Encounter: Admit: 2024-04-24 | Discharge: 2024-04-24 | Payer: MEDICARE

## 2024-04-24 DIAGNOSIS — I442 Atrioventricular block, complete: Secondary | ICD-10-CM

## 2024-04-24 DIAGNOSIS — Z950 Presence of cardiac pacemaker: Principal | ICD-10-CM

## 2024-05-03 ENCOUNTER — Encounter: Admit: 2024-05-03 | Discharge: 2024-05-03 | Payer: MEDICARE

## 2024-05-10 ENCOUNTER — Encounter: Admit: 2024-05-10 | Discharge: 2024-05-10 | Payer: MEDICARE

## 2024-05-21 ENCOUNTER — Ambulatory Visit: Admit: 2024-05-21 | Discharge: 2024-05-21 | Payer: MEDICARE

## 2024-05-21 ENCOUNTER — Encounter: Admit: 2024-05-21 | Discharge: 2024-05-21 | Payer: MEDICARE

## 2024-08-07 ENCOUNTER — Encounter: Admit: 2024-08-07 | Discharge: 2024-08-07 | Payer: MEDICARE

## 2024-08-07 NOTE — Telephone Encounter [36]
 Called patient to request device transmission no answer, voicemail unavailable

## 2024-08-08 ENCOUNTER — Encounter: Admit: 2024-08-08 | Discharge: 2024-08-08 | Payer: MEDICARE

## 2024-08-12 ENCOUNTER — Ambulatory Visit: Admit: 2024-08-12 | Discharge: 2024-08-12 | Payer: MEDICARE

## 2024-08-12 ENCOUNTER — Encounter: Admit: 2024-08-12 | Discharge: 2024-08-12 | Payer: MEDICARE

## 2024-08-15 ENCOUNTER — Encounter: Admit: 2024-08-15 | Discharge: 2024-08-15 | Payer: MEDICARE
